# Patient Record
Sex: Male | Born: 1995 | Race: Black or African American | Hispanic: No | Marital: Single | Smoking: Never smoker
Health system: Southern US, Community
[De-identification: ages and names within clinical notes are randomized; demographics above are authoritative.]

## PROBLEM LIST (undated history)

## (undated) ENCOUNTER — Ambulatory Visit: Admission: EM | Payer: Medicaid Other | Source: Home / Self Care

## (undated) DIAGNOSIS — J45909 Unspecified asthma, uncomplicated: Secondary | ICD-10-CM

## (undated) DIAGNOSIS — F840 Autistic disorder: Secondary | ICD-10-CM

## (undated) DIAGNOSIS — F419 Anxiety disorder, unspecified: Secondary | ICD-10-CM

---
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
## 2014-03-07 ENCOUNTER — Emergency Department: Payer: Self-pay | Admitting: Emergency Medicine

## 2014-03-24 ENCOUNTER — Emergency Department: Payer: Self-pay | Admitting: Emergency Medicine

## 2014-04-15 ENCOUNTER — Emergency Department: Payer: Self-pay | Admitting: Internal Medicine

## 2014-05-16 ENCOUNTER — Emergency Department: Payer: Self-pay | Admitting: Emergency Medicine

## 2014-05-16 LAB — URINALYSIS, COMPLETE
Bacteria: NONE SEEN
Bilirubin,UR: NEGATIVE
Blood: NEGATIVE
Glucose,UR: NEGATIVE mg/dL (ref 0–75)
Ketone: NEGATIVE
Leukocyte Esterase: NEGATIVE
Nitrite: NEGATIVE
PROTEIN: NEGATIVE
Ph: 7 (ref 4.5–8.0)
RBC, UR: NONE SEEN /HPF (ref 0–5)
Specific Gravity: 1.004 (ref 1.003–1.030)
Squamous Epithelial: NONE SEEN
WBC UR: <1 /HPF (ref 0–5)

## 2014-05-16 LAB — COMPREHENSIVE METABOLIC PANEL
ALT: 106 U/L — AB
Albumin: 4.1 g/dL (ref 3.8–5.6)
Alkaline Phosphatase: 110 U/L
Anion Gap: 6 — ABNORMAL LOW (ref 7–16)
BILIRUBIN TOTAL: 0.4 mg/dL (ref 0.2–1.0)
BUN: 12 mg/dL (ref 9–21)
CHLORIDE: 105 mmol/L (ref 97–107)
CO2: 28 mmol/L — AB (ref 16–25)
Calcium, Total: 8.3 mg/dL — ABNORMAL LOW (ref 9.0–10.7)
Creatinine: 0.83 mg/dL (ref 0.60–1.30)
EGFR (African American): >60
EGFR (Non-African Amer.): >60
Glucose: 84 mg/dL (ref 65–99)
OSMOLALITY: 276 (ref 275–301)
Potassium: 3.9 mmol/L (ref 3.3–4.7)
SGOT(AST): 62 U/L — ABNORMAL HIGH (ref 10–41)
Sodium: 139 mmol/L (ref 132–141)
Total Protein: 7.4 g/dL (ref 6.4–8.6)

## 2014-05-16 LAB — CBC WITH DIFFERENTIAL/PLATELET
BASOS PCT: 1.1 %
Basophil #: 0.1 10*3/uL (ref 0.0–0.1)
EOS PCT: 1.8 %
Eosinophil #: 0.1 10*3/uL (ref 0.0–0.7)
HCT: 49.1 % (ref 40.0–52.0)
HGB: 16.3 g/dL (ref 13.0–18.0)
Lymphocyte #: 1.8 10*3/uL (ref 1.0–3.6)
Lymphocyte %: 31.6 %
MCH: 28 pg (ref 26.0–34.0)
MCHC: 33.1 g/dL (ref 32.0–36.0)
MCV: 85 fL (ref 80–100)
MONO ABS: 0.7 x10 3/mm (ref 0.2–1.0)
MONOS PCT: 12.8 %
NEUTROS ABS: 3 10*3/uL (ref 1.4–6.5)
NEUTROS PCT: 52.7 %
PLATELETS: 216 10*3/uL (ref 150–440)
RBC: 5.82 10*6/uL (ref 4.40–5.90)
RDW: 13.4 % (ref 11.5–14.5)
WBC: 5.7 10*3/uL (ref 3.8–10.6)

## 2014-05-16 LAB — LIPASE, BLOOD: Lipase: 95 U/L (ref 73–393)

## 2014-05-17 ENCOUNTER — Emergency Department: Payer: Self-pay | Admitting: Emergency Medicine

## 2014-05-26 ENCOUNTER — Emergency Department: Payer: Self-pay | Admitting: Emergency Medicine

## 2014-05-26 LAB — COMPREHENSIVE METABOLIC PANEL
ALK PHOS: 107 U/L
Albumin: 4 g/dL (ref 3.8–5.6)
Anion Gap: 10 (ref 7–16)
BILIRUBIN TOTAL: 0.4 mg/dL (ref 0.2–1.0)
BUN: 11 mg/dL (ref 9–21)
CHLORIDE: 106 mmol/L (ref 97–107)
Calcium, Total: 8.1 mg/dL — ABNORMAL LOW (ref 9.0–10.7)
Co2: 25 mmol/L (ref 16–25)
Creatinine: 0.75 mg/dL (ref 0.60–1.30)
EGFR (Non-African Amer.): >60
GLUCOSE: 121 mg/dL — AB (ref 65–99)
OSMOLALITY: 282 (ref 275–301)
Potassium: 3.8 mmol/L (ref 3.3–4.7)
SGOT(AST): 70 U/L — ABNORMAL HIGH (ref 10–41)
SGPT (ALT): 78 U/L — ABNORMAL HIGH
Sodium: 141 mmol/L (ref 132–141)
TOTAL PROTEIN: 7.1 g/dL (ref 6.4–8.6)

## 2014-05-26 LAB — URINALYSIS, COMPLETE
BLOOD: NEGATIVE
Bacteria: NONE SEEN
Bilirubin,UR: NEGATIVE
GLUCOSE, UR: NEGATIVE mg/dL (ref 0–75)
Ketone: NEGATIVE
Leukocyte Esterase: NEGATIVE
Nitrite: NEGATIVE
PH: 7 (ref 4.5–8.0)
Protein: NEGATIVE
RBC,UR: NONE SEEN /HPF (ref 0–5)
SPECIFIC GRAVITY: 1.021 (ref 1.003–1.030)
Squamous Epithelial: <1

## 2014-05-26 LAB — CBC WITH DIFFERENTIAL/PLATELET
Basophil #: 0 10*3/uL (ref 0.0–0.1)
Basophil %: 0.4 %
EOS ABS: 0.3 10*3/uL (ref 0.0–0.7)
Eosinophil %: 3.9 %
HCT: 48.1 % (ref 40.0–52.0)
HGB: 16.1 g/dL (ref 13.0–18.0)
LYMPHS ABS: 1.7 10*3/uL (ref 1.0–3.6)
LYMPHS PCT: 22.5 %
MCH: 28.1 pg (ref 26.0–34.0)
MCHC: 33.4 g/dL (ref 32.0–36.0)
MCV: 84 fL (ref 80–100)
MONO ABS: 0.9 x10 3/mm (ref 0.2–1.0)
Monocyte %: 11.8 %
Neutrophil #: 4.7 10*3/uL (ref 1.4–6.5)
Neutrophil %: 61.4 %
PLATELETS: 195 10*3/uL (ref 150–440)
RBC: 5.72 10*6/uL (ref 4.40–5.90)
RDW: 13.8 % (ref 11.5–14.5)
WBC: 7.7 10*3/uL (ref 3.8–10.6)

## 2014-05-26 LAB — LIPASE, BLOOD: Lipase: 86 U/L (ref 73–393)

## 2014-09-06 ENCOUNTER — Emergency Department: Payer: Self-pay | Admitting: Emergency Medicine

## 2014-09-06 LAB — COMPREHENSIVE METABOLIC PANEL
Albumin: 3.7 g/dL — ABNORMAL LOW (ref 3.8–5.6)
Alkaline Phosphatase: 103 U/L (ref 46–116)
Anion Gap: 6 — ABNORMAL LOW (ref 7–16)
BILIRUBIN TOTAL: 0.4 mg/dL (ref 0.2–1.0)
BUN: 11 mg/dL (ref 7–18)
CHLORIDE: 105 mmol/L (ref 98–107)
Calcium, Total: 8.3 mg/dL — ABNORMAL LOW (ref 9.0–10.7)
Co2: 29 mmol/L (ref 21–32)
Creatinine: 0.86 mg/dL (ref 0.60–1.30)
EGFR (African American): >60
Glucose: 94 mg/dL (ref 65–99)
Osmolality: 279 (ref 275–301)
POTASSIUM: 3.8 mmol/L (ref 3.5–5.1)
SGOT(AST): 46 U/L — ABNORMAL HIGH (ref 10–41)
SGPT (ALT): 72 U/L — ABNORMAL HIGH (ref 14–63)
Sodium: 140 mmol/L (ref 136–145)
Total Protein: 6.9 g/dL (ref 6.4–8.6)

## 2014-09-06 LAB — CBC WITH DIFFERENTIAL/PLATELET
BASOS ABS: 0.1 10*3/uL (ref 0.0–0.1)
Basophil %: 0.7 %
EOS ABS: 0.2 10*3/uL (ref 0.0–0.7)
EOS PCT: 2.3 %
HCT: 46.6 % (ref 40.0–52.0)
HGB: 15.7 g/dL (ref 13.0–18.0)
LYMPHS PCT: 27.2 %
Lymphocyte #: 2.4 10*3/uL (ref 1.0–3.6)
MCH: 27.9 pg (ref 26.0–34.0)
MCHC: 33.7 g/dL (ref 32.0–36.0)
MCV: 83 fL (ref 80–100)
MONO ABS: 1.2 x10 3/mm — AB (ref 0.2–1.0)
MONOS PCT: 13.3 %
NEUTROS PCT: 56.5 %
Neutrophil #: 4.9 10*3/uL (ref 1.4–6.5)
Platelet: 190 10*3/uL (ref 150–440)
RBC: 5.62 10*6/uL (ref 4.40–5.90)
RDW: 13.5 % (ref 11.5–14.5)
WBC: 8.6 10*3/uL (ref 3.8–10.6)

## 2014-09-06 LAB — URINALYSIS, COMPLETE
BLOOD: NEGATIVE
Bilirubin,UR: NEGATIVE
Glucose,UR: NEGATIVE mg/dL (ref 0–75)
Ketone: NEGATIVE
LEUKOCYTE ESTERASE: NEGATIVE
NITRITE: NEGATIVE
PROTEIN: NEGATIVE
Ph: 8 (ref 4.5–8.0)
RBC, UR: NONE SEEN /HPF (ref 0–5)
Specific Gravity: 1.016 (ref 1.003–1.030)
Squamous Epithelial: NONE SEEN
WBC UR: <1 /HPF (ref 0–5)

## 2014-09-06 LAB — LIPASE, BLOOD: Lipase: 87 U/L (ref 73–393)

## 2014-10-17 ENCOUNTER — Emergency Department: Payer: Self-pay | Admitting: Student

## 2014-11-08 ENCOUNTER — Emergency Department
Admit: 2014-11-08 | Disposition: A | Discharge status: Home / Self Care | Payer: Self-pay | Admitting: Emergency Medicine

## 2014-11-18 ENCOUNTER — Emergency Department
Admit: 2014-11-18 | Disposition: A | Discharge status: Home / Self Care | Payer: Self-pay | Admitting: Emergency Medicine

## 2014-11-18 LAB — CBC WITH DIFFERENTIAL/PLATELET
BASOS ABS: 0.1 10*3/uL (ref 0.0–0.1)
BASOS PCT: 1 %
EOS PCT: 6.9 %
Eosinophil #: 0.6 10*3/uL (ref 0.0–0.7)
HCT: 48.4 % (ref 40.0–52.0)
HGB: 16.5 g/dL (ref 13.0–18.0)
LYMPHS PCT: 26.6 %
Lymphocyte #: 2.4 10*3/uL (ref 1.0–3.6)
MCH: 27.7 pg (ref 26.0–34.0)
MCHC: 34 g/dL (ref 32.0–36.0)
MCV: 81 fL (ref 80–100)
Monocyte #: 1.2 x10 3/mm — ABNORMAL HIGH (ref 0.2–1.0)
Monocyte %: 13.4 %
NEUTROS ABS: 4.7 10*3/uL (ref 1.4–6.5)
Neutrophil %: 52.1 %
Platelet: 193 10*3/uL (ref 150–440)
RBC: 5.95 10*6/uL — ABNORMAL HIGH (ref 4.40–5.90)
RDW: 13.7 % (ref 11.5–14.5)
WBC: 9 10*3/uL (ref 3.8–10.6)

## 2014-11-18 LAB — URINALYSIS, COMPLETE
BACTERIA: NONE SEEN
BILIRUBIN, UR: NEGATIVE
Blood: NEGATIVE
GLUCOSE, UR: NEGATIVE mg/dL (ref 0–75)
KETONE: NEGATIVE
LEUKOCYTE ESTERASE: NEGATIVE
Nitrite: NEGATIVE
Ph: 6 (ref 4.5–8.0)
Protein: NEGATIVE
SQUAMOUS EPITHELIAL: NONE SEEN
Specific Gravity: 1.02 (ref 1.003–1.030)

## 2014-11-18 LAB — COMPREHENSIVE METABOLIC PANEL
ALBUMIN: 4.6 g/dL
ALK PHOS: 95 U/L
ALT: 33 U/L
ANION GAP: 8 (ref 7–16)
BUN: 12 mg/dL
Bilirubin,Total: 0.7 mg/dL
CHLORIDE: 106 mmol/L
CO2: 28 mmol/L
Calcium, Total: 8.9 mg/dL
Creatinine: 0.86 mg/dL
EGFR (African American): >60
EGFR (Non-African Amer.): >60
Glucose: 99 mg/dL
Potassium: 3.9 mmol/L
SGOT(AST): 30 U/L
Sodium: 142 mmol/L
TOTAL PROTEIN: 7.4 g/dL

## 2014-11-18 LAB — LIPASE, BLOOD: LIPASE: 26 U/L

## 2014-12-15 ENCOUNTER — Emergency Department
Admission: EM | Admit: 2014-12-15 | Discharge: 2014-12-15 | Disposition: A | Discharge status: Home / Self Care | Payer: Medicaid Other | Attending: Emergency Medicine | Admitting: Emergency Medicine

## 2014-12-15 DIAGNOSIS — T22132A Burn of first degree of left upper arm, initial encounter: Secondary | ICD-10-CM | POA: Insufficient documentation

## 2014-12-15 DIAGNOSIS — Y93G9 Activity, other involving cooking and grilling: Secondary | ICD-10-CM | POA: Insufficient documentation

## 2014-12-15 DIAGNOSIS — Y92000 Kitchen of unspecified non-institutional (private) residence as  the place of occurrence of the external cause: Secondary | ICD-10-CM | POA: Insufficient documentation

## 2014-12-15 DIAGNOSIS — Y998 Other external cause status: Secondary | ICD-10-CM | POA: Diagnosis not present

## 2014-12-15 DIAGNOSIS — X150XXA Contact with hot stove (kitchen), initial encounter: Secondary | ICD-10-CM | POA: Insufficient documentation

## 2014-12-15 DIAGNOSIS — T2210XA Burn of first degree of shoulder and upper limb, except wrist and hand, unspecified site, initial encounter: Secondary | ICD-10-CM

## 2014-12-15 MED ORDER — IBUPROFEN 800 MG PO TABS: 800.0000 mg | ORAL_TABLET | Freq: Three times a day (TID) | ORAL | Status: AC | PRN
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
## 2014-12-15 NOTE — Discharge Instructions (Signed)
Burn Care °Burns hurt your skin. When your skin is hurt, it is easier to get an infection. Follow your doctor's directions to help prevent an infection. °HOME CARE °· Wash your hands well before you change your bandage. °· Change your bandage as often as told by your doctor. °¨ Remove the old bandage. If the bandage sticks, soak it off with cool, clean water. °¨ Gently clean the burn with mild soap and water. °¨ Pat the burn dry with a clean, dry cloth. °¨ Put a thin layer of medicated cream on the burn. °¨ Put a clean bandage on as told by your doctor. °¨ Keep the bandage clean and dry. °· Raise (elevate) the burn for the first 24 hours. After that, follow your doctor's directions. °· Only take medicine as told by your doctor. °GET HELP RIGHT AWAY IF:  °· You have too much pain. °· The skin near the burn is red, tender, puffy (swollen), or has red streaks. °· The burn area has yellowish white fluid (pus) or a bad smell coming from it. °· You have a fever. °MAKE SURE YOU:  °· Understand these instructions. °· Will watch your condition. °· Will get help right away if you are not doing well or get worse. °Document Released: 04/30/2008 Document Revised: 10/14/2011 Document Reviewed: 12/12/2010 °ExitCare® Patient Information ©2015 ExitCare, [employer]. This information is not intended to replace advice given to you by your health care provider. Make sure you discuss any questions you have with your health care provider. ° °
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
## 2015-05-01 ENCOUNTER — Encounter: Payer: Self-pay | Admitting: Emergency Medicine

## 2015-05-01 ENCOUNTER — Emergency Department
Admission: EM | Admit: 2015-05-01 | Discharge: 2015-05-01 | Disposition: A | Discharge status: Home / Self Care | Payer: No Typology Code available for payment source | Attending: Emergency Medicine | Admitting: Emergency Medicine

## 2015-05-01 DIAGNOSIS — Y998 Other external cause status: Secondary | ICD-10-CM | POA: Insufficient documentation

## 2015-05-01 DIAGNOSIS — Y92410 Unspecified street and highway as the place of occurrence of the external cause: Secondary | ICD-10-CM | POA: Diagnosis not present

## 2015-05-01 DIAGNOSIS — Y9389 Activity, other specified: Secondary | ICD-10-CM | POA: Diagnosis not present

## 2015-05-01 DIAGNOSIS — S199XXA Unspecified injury of neck, initial encounter: Secondary | ICD-10-CM | POA: Diagnosis present

## 2015-05-01 DIAGNOSIS — S161XXA Strain of muscle, fascia and tendon at neck level, initial encounter: Secondary | ICD-10-CM | POA: Diagnosis not present

## 2015-05-01 MED ORDER — IBUPROFEN 800 MG PO TABS: 800.0000 mg | ORAL_TABLET | Freq: Three times a day (TID) | ORAL | Status: DC | PRN

## 2015-05-01 MED ORDER — CYCLOBENZAPRINE HCL 10 MG PO TABS: 10.0000 mg | ORAL_TABLET | Freq: Three times a day (TID) | ORAL | Status: DC | PRN

## 2015-05-01 NOTE — Discharge Instructions (Signed)
Cervical Sprain A cervical sprain is when the tissues (ligaments) that hold the neck bones in place stretch or tear. HOME CARE   Put ice on the injured area.  Put ice in a plastic bag.  Place a towel between your skin and the bag.  Leave the ice on for 15-20 minutes, 3-4 times a day.  You may have been given a collar to wear. This collar keeps your neck from moving while you heal.  Do not take the collar off unless told by your doctor.  If you have long hair, keep it outside of the collar.  Ask your doctor before changing the position of your collar. You may need to change its position over time to make it more comfortable.  If you are allowed to take off the collar for cleaning or bathing, follow your doctor's instructions on how to do it safely.  Keep your collar clean by wiping it with mild soap and water. Dry it completely. If the collar has removable pads, remove them every 1-2 days to hand wash them with soap and water. Allow them to air dry. They should be dry before you wear them in the collar.  Do not drive while wearing the collar.  Only take medicine as told by your doctor.  Keep all doctor visits as told.  Keep all physical therapy visits as told.  Adjust your work station so that you have good posture while you work.  Avoid positions and activities that make your problems worse.  Warm up and stretch before being active. GET HELP IF:  Your pain is not controlled with medicine.  You cannot take less pain medicine over time as planned.  Your activity level does not improve as expected. GET HELP RIGHT AWAY IF:   You are bleeding.  Your stomach is upset.  You have an allergic reaction to your medicine.  You develop new problems that you cannot explain.  You lose feeling (become numb) or you cannot move any part of your body (paralysis).  You have tingling or weakness in any part of your body.  Your symptoms get worse. Symptoms include:  Pain,  soreness, stiffness, puffiness (swelling), or a burning feeling in your neck.  Pain when your neck is touched.  Shoulder or upper back pain.  Limited ability to move your neck.  Headache.  Dizziness.  Your hands or arms feel week, lose feeling, or tingle.  Muscle spasms.  Difficulty swallowing or chewing. MAKE SURE YOU:   Understand these instructions.  Will watch your condition.  Will get help right away if you are not doing well or get worse. Document Released: 01/08/2008 Document Revised: 03/24/2013 Document Reviewed: 01/27/2013 ExitCare Patient Information 2015 [employer]. This information is not intended to replace advice given to you by your health care provider. Make sure you discuss any questions you have with your health care provider.  
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
## 2015-06-07 ENCOUNTER — Emergency Department: Payer: Medicaid Other

## 2015-06-07 ENCOUNTER — Emergency Department
Admission: EM | Admit: 2015-06-07 | Discharge: 2015-06-07 | Disposition: A | Discharge status: Home / Self Care | Payer: Medicaid Other | Attending: Emergency Medicine | Admitting: Emergency Medicine

## 2015-06-07 ENCOUNTER — Encounter: Payer: Self-pay | Admitting: Urgent Care

## 2015-06-07 DIAGNOSIS — J9801 Acute bronchospasm: Secondary | ICD-10-CM | POA: Insufficient documentation

## 2015-06-07 DIAGNOSIS — R05 Cough: Secondary | ICD-10-CM | POA: Diagnosis present

## 2015-06-07 MED ORDER — PSEUDOEPH-BROMPHEN-DM 30-2-10 MG/5ML PO SYRP: 5.0000 mL | ORAL_SOLUTION | Freq: Four times a day (QID) | ORAL | Status: DC | PRN

## 2015-06-07 MED ORDER — BENZONATATE 100 MG PO CAPS: 100.0000 mg | ORAL_CAPSULE | Freq: Three times a day (TID) | ORAL | Status: DC | PRN

## 2015-06-07 MED ORDER — ALBUTEROL SULFATE HFA 108 (90 BASE) MCG/ACT IN AERS: 2.0000 | INHALATION_SPRAY | Freq: Four times a day (QID) | RESPIRATORY_TRACT | Status: DC | PRN

## 2015-06-07 MED ORDER — BENZONATATE 100 MG PO CAPS
200.0000 mg | ORAL_CAPSULE | Freq: Once | ORAL | Status: AC
  Administered 2015-06-07: 200 mg via ORAL
  Filled 2015-06-07: qty 2

## 2015-06-07 MED ORDER — IPRATROPIUM-ALBUTEROL 0.5-2.5 (3) MG/3ML IN SOLN
3.0000 mL | Freq: Once | RESPIRATORY_TRACT | Status: AC
  Administered 2015-06-07: 3 mL via RESPIRATORY_TRACT
  Filled 2015-06-07: qty 3

## 2015-06-07 MED ORDER — AZITHROMYCIN 250 MG PO TABS: ORAL_TABLET | ORAL | Status: DC

## 2015-06-07 NOTE — ED Provider Notes (Signed)
[facility] Emergency Department Provider Note ____________________________________________  Time seen: 2255  I have reviewed the triage vital signs and the nursing notes.  HISTORY  Chief Complaint  Cough  HPI Fred Harris is a 19 y.o. male reports to the ED accompanied by his mother, for evaluation of a productive cough for the last 2 and half weeks. He reports that he's had yellow sputum intermittently with his cough. He denies any interim fever, chills, sweats. He also denies any significant shortness of breath with the symptoms he denies any history of sick contacts or recent travel. He is been dosing Tylenol cold and sinus as well as BC powders without significant change to his symptoms.He does not report any significant pain related to his current symptoms.  History reviewed. No pertinent past medical history.  There are no active problems to display for this patient.  History reviewed. No pertinent past surgical history.  Current Outpatient Rx  Name  Route  Sig  Dispense  Refill  . albuterol (PROVENTIL HFA;VENTOLIN HFA) 108 (90 BASE) MCG/ACT inhaler   Inhalation   Inhale 2 puffs into the lungs every 6 (six) hours as needed for wheezing or shortness of breath.   1 Inhaler   0   . azithromycin (ZITHROMAX Z-PAK) 250 MG tablet      Take 2 tablets (500 mg) on  Day 1,  followed by 1 tablet (250 mg) once daily on Days 2 through 5.   6 each   0   . benzonatate (TESSALON PERLES) 100 MG capsule   Oral   Take 1 capsule (100 mg total) by mouth 3 (three) times daily as needed for cough (Take 1-2 per dose).   30 capsule   0   . brompheniramine-pseudoephedrine-DM 30-2-10 MG/5ML syrup   Oral   Take 5 mLs by mouth 4 (four) times daily as needed.   120 mL   0   . cyclobenzaprine (FLEXERIL) 10 MG tablet   Oral   Take 1 tablet (10 mg total) by mouth every 8 (eight) hours as needed for muscle spasms.   15 tablet   0   . ibuprofen (ADVIL,MOTRIN) 800 MG  tablet   Oral   Take 1 tablet (800 mg total) by mouth every 8 (eight) hours as needed for moderate pain.   30 tablet   0   . ibuprofen (ADVIL,MOTRIN) 800 MG tablet   Oral   Take 1 tablet (800 mg total) by mouth every 8 (eight) hours as needed for moderate pain.   15 tablet   0     Allergies Review of patient's allergies indicates no known allergies.  No family history on file.  Social History Social History  Substance Use Topics  . Smoking status: Never Smoker   . Smokeless tobacco: None  . Alcohol Use: No   Review of Systems  Constitutional: Negative for fever. Eyes: Negative for visual changes. ENT: Negative for sore throat. Cardiovascular: Negative for chest pain. Respiratory: Negative for shortness of breath. Reports intermittent cough as above. Gastrointestinal: Negative for abdominal pain, vomiting and diarrhea. Genitourinary: Negative for dysuria. Musculoskeletal: Negative for back pain. Skin: Negative for rash. Neurological: Negative for headaches, focal weakness or numbness. ____________________________________________  PHYSICAL EXAM:  VITAL SIGNS: ED Triage Vitals  Enc Vitals Group     BP 06/07/15 2218 157/87 mmHg     Pulse Rate 06/07/15 2218 82     Resp 06/07/15 2218 18     Temp 06/07/15 2218 98.9 F (  37.2 C)     Temp Source 06/07/15 2218 Oral     SpO2 06/07/15 2218 96 %     Weight 06/07/15 2218 220 lb (99.791 kg)     Height 06/07/15 2218  (1.753 m)     Head Cir --      Peak Flow --      Pain Score 06/07/15 2219 0     Pain Loc --      Pain Edu? --      Excl. in GC? --    Constitutional: Alert and oriented. Well appearing and in no distress. Head: Normocephalic and atraumatic.      Eyes: Conjunctivae are normal. PERRL. Normal extraocular movements      Ears: Canals clear. TMs intact bilaterally.   Nose: No congestion/rhinorrhea.   Mouth/Throat: Mucous membranes are moist.   Neck: Supple. No  thyromegaly. Hematological/Lymphatic/Immunological: No cervical lymphadenopathy. Cardiovascular: Normal rate, regular rhythm.  Respiratory: Normal respiratory effort. No wheezes/rales/rhonchi. Gastrointestinal: Soft and nontender. No distention. Musculoskeletal: Nontender with normal range of motion in all extremities.  Neurologic:  Normal gait without ataxia. Normal speech and language. No gross focal neurologic deficits are appreciated. Skin:  Skin is warm, dry and intact. No rash noted. Psychiatric: Mood and affect are normal. Patient exhibits appropriate insight and judgment. ____________________________________________   RADIOLOGY CXR IMPRESSION: Increased lung volumes can be seen with reactive airway disease without focal consolidation.  I, Jarrah Babich, Charlesetta Ivory, personally viewed and evaluated these images (plain radiographs) as part of my medical decision making.  ____________________________________________  PROCEDURES  Duoneb x 1 Tessalon cap 200 mg PO ____________________________________________  INITIAL IMPRESSION / ASSESSMENT AND PLAN / ED COURSE  Acute bronchospasm likely with underlying reactive airway disease. Patient will be discharged with prescriptions for albuterol MDI, Tessalon Perles, Bromfed DM, and a Z-Pak. He is encouraged to follow-up with his primary care provider for worsening symptoms. He is given instructions on reasons to return to the ED. School note is provided for 1 day as requested. ____________________________________________  FINAL CLINICAL IMPRESSION(S) / ED DIAGNOSES  Final diagnoses:  Bronchospasm, acute      Lissa Hoard, PA-C 06/07/15 2350  Darien Ramus, MD 06/12/15 1355

## 2015-06-07 NOTE — Discharge Instructions (Signed)
Bronchospasm, Adult  A bronchospasm is a spasm or tightening of the airways going into the lungs. During a bronchospasm breathing becomes more difficult because the airways get smaller. When this happens there can be coughing, a whistling sound when breathing (wheezing), and difficulty breathing. Bronchospasm is often associated with asthma, but not all patients who experience a bronchospasm have asthma.  CAUSES   A bronchospasm is caused by inflammation or irritation of the airways. The inflammation or irritation may be triggered by:   · Allergies (such as to animals, pollen, food, or mold). Allergens that cause bronchospasm may cause wheezing immediately after exposure or many hours later.    · Infection. Viral infections are believed to be the most common cause of bronchospasm.    · Exercise.    · Irritants (such as pollution, cigarette smoke, strong odors, aerosol sprays, and paint fumes).    · Weather changes. Winds increase molds and pollens in the air. Rain refreshes the air by washing irritants out. Cold air may cause inflammation.    · Stress and emotional upset.    SIGNS AND SYMPTOMS   · Wheezing.    · Excessive nighttime coughing.    · Frequent or severe coughing with a simple cold.    · Chest tightness.    · Shortness of breath.    DIAGNOSIS   Bronchospasm is usually diagnosed through a history and physical exam. Tests, such as chest X-rays, are sometimes done to look for other conditions.  TREATMENT   · Inhaled medicines can be given to open up your airways and help you breathe. The medicines can be given using either an inhaler or a nebulizer machine.  · Corticosteroid medicines may be given for severe bronchospasm, usually when it is associated with asthma.  HOME CARE INSTRUCTIONS   · Always have a plan prepared for seeking medical care. Know when to call your health care provider and local emergency services (911 in the [place] Know where you can access local emergency care.  · Only take medicines as  directed by your health care provider.  · If you were prescribed an inhaler or nebulizer machine, ask your health care provider to explain how to use it correctly. Always use a spacer with your inhaler if you were given one.  · It is necessary to remain calm during an attack. Try to relax and breathe more slowly.   · Control your home environment in the following ways:      Change your heating and air conditioning filter at least once a month.      Limit your use of fireplaces and wood stoves.    Do not smoke and do not allow smoking in your home.      Avoid exposure to perfumes and fragrances.      Get rid of pests (such as roaches and mice) and their droppings.      Throw away plants if you see mold on them.      Keep your house clean and dust free.      Replace carpet with wood, tile, or vinyl flooring. Carpet can trap dander and dust.      Use allergy-proof pillows, mattress covers, and box spring covers.      Wash bed sheets and blankets every week in hot water and dry them in a dryer.      Use blankets that are made of polyester or cotton.      Wash hands frequently.  SEEK MEDICAL CARE IF:   · You have muscle aches.    · You have chest pain.    · The sputum changes from clear or   even after taking your prescribed medicines.   You have increased difficulty breathing.   You develop severe chest pain. MAKE SURE YOU:   Understand these instructions.  Will watch your condition.  Will get help right away if you are not doing well or get worse.   This information is not intended to replace advice given to you by your health care provider. Make sure you discuss any questions you have with your health care  provider.   Document Released: 07/25/2003 Document Revised: 08/12/2014 Document Reviewed: 01/11/2013 Elsevier Interactive Patient Education 2016 [employer]   Take the prescription meds as directed.  Increase fluid intake to reduce symptoms.  [facility] or your provider as needed.

## 2015-06-07 NOTE — ED Notes (Signed)
Patient presents with reports a productive cough x 3 weeks. (+) yellow sputum production reported. Denies fever. NOS reported.

## 2015-06-23 ENCOUNTER — Emergency Department
Admission: EM | Admit: 2015-06-23 | Discharge: 2015-06-23 | Disposition: A | Discharge status: Home / Self Care | Payer: Medicaid Other | Attending: Student | Admitting: Student

## 2015-06-23 ENCOUNTER — Encounter: Payer: Self-pay | Admitting: *Deleted

## 2015-06-23 DIAGNOSIS — J069 Acute upper respiratory infection, unspecified: Secondary | ICD-10-CM | POA: Diagnosis not present

## 2015-06-23 DIAGNOSIS — R05 Cough: Secondary | ICD-10-CM | POA: Diagnosis present

## 2015-06-23 HISTORY — DX: Unspecified asthma, uncomplicated: J45.909

## 2015-06-23 MED ORDER — PROMETHAZINE-DM 6.25-15 MG/5ML PO SYRP: 5.0000 mL | ORAL_SOLUTION | Freq: Four times a day (QID) | ORAL | Status: DC | PRN

## 2015-06-23 NOTE — Discharge Instructions (Signed)

## 2015-06-23 NOTE — ED Provider Notes (Signed)
[facility] Emergency Department Provider Note  ____________________________________________  Time seen: Approximately 2:54 PM  I have reviewed the triage vital signs and the nursing notes.   HISTORY  Chief Complaint Cough    HPI Fred Harris is a 19 y.o. male complaining of cough productive for 2 days. Patient was seen2 weeks ago but cannot afford the cough medication prescribed. Patient state his cough got better until yesterday. Patient denies any fever or chills associated with this complaint. Patient has no nausea vomiting or diarrhea. Patient state there is no pain.  Past Medical History  Diagnosis Date  . Asthma     There are no active problems to display for this patient.   History reviewed. No pertinent past surgical history.  Current Outpatient Rx  Name  Route  Sig  Dispense  Refill  . albuterol (PROVENTIL HFA;VENTOLIN HFA) 108 (90 BASE) MCG/ACT inhaler   Inhalation   Inhale 2 puffs into the lungs every 6 (six) hours as needed for wheezing or shortness of breath.   1 Inhaler   0   . azithromycin (ZITHROMAX Z-PAK) 250 MG tablet      Take 2 tablets (500 mg) on  Day 1,  followed by 1 tablet (250 mg) once daily on Days 2 through 5.   6 each   0   . benzonatate (TESSALON PERLES) 100 MG capsule   Oral   Take 1 capsule (100 mg total) by mouth 3 (three) times daily as needed for cough (Take 1-2 per dose).   30 capsule   0   . brompheniramine-pseudoephedrine-DM 30-2-10 MG/5ML syrup   Oral   Take 5 mLs by mouth 4 (four) times daily as needed.   120 mL   0   . cyclobenzaprine (FLEXERIL) 10 MG tablet   Oral   Take 1 tablet (10 mg total) by mouth every 8 (eight) hours as needed for muscle spasms.   15 tablet   0   . ibuprofen (ADVIL,MOTRIN) 800 MG tablet   Oral   Take 1 tablet (800 mg total) by mouth every 8 (eight) hours as needed for moderate pain.   30 tablet   0   . ibuprofen (ADVIL,MOTRIN) 800 MG tablet   Oral   Take 1  tablet (800 mg total) by mouth every 8 (eight) hours as needed for moderate pain.   15 tablet   0   . promethazine-dextromethorphan (PROMETHAZINE-DM) 6.25-15 MG/5ML syrup   Oral   Take 5 mLs by mouth 4 (four) times daily as needed for cough.   118 mL   0     Allergies Review of patient's allergies indicates no known allergies.  No family history on file.  Social History Social History  Substance Use Topics  . Smoking status: Never Smoker   . Smokeless tobacco: None  . Alcohol Use: No    Review of Systems Constitutional: No fever/chills Eyes: No visual changes. ENT: No sore throat. Cardiovascular: Denies chest pain. Respiratory: Denies shortness of breath. Gastrointestinal: No abdominal pain.  No nausea, no vomiting.  No diarrhea.  No constipation. Genitourinary: Negative for dysuria. Musculoskeletal: Negative for back pain. Skin: Negative for rash. Neurological: Negative for headaches, focal weakness or numbness. 10-point ROS otherwise negative.  ____________________________________________   PHYSICAL EXAM:  VITAL SIGNS: ED Triage Vitals  Enc Vitals Group     BP 06/23/15 1423 134/85 mmHg     Pulse --      Resp 06/23/15 1423 18  Temp 06/23/15 1423 98.4 F (36.9 C)     Temp Source 06/23/15 1416 Oral     SpO2 06/23/15 1416 97 %     Weight 06/23/15 1423 185 lb (83.915 kg)     Height 06/23/15 1423 5\' 8"  (1.727 m)     Head Cir --      Peak Flow --      Pain Score --      Pain Loc --      Pain Edu? --      Excl. in GC? --     Constitutional: Alert and oriented. Well appearing and in no acute distress. Eyes: Conjunctivae are normal. PERRL. EOMI. Head: Atraumatic. Nose: No congestion/rhinnorhea. Mouth/Throat: Mucous membranes are moist.  Oropharynx non-erythematous. Neck: No stridor.  No cervical spine tenderness to palpation. Hematological/Lymphatic/Immunilogical: No cervical lymphadenopathy. Cardiovascular: Normal rate, regular rhythm. Grossly normal  heart sounds.  Good peripheral circulation. Respiratory: Normal respiratory effort.  No retractions. Lungs CTAB. Dr. cough Gastrointestinal: Soft and nontender. No distention. No abdominal bruits. No CVA tenderness. Musculoskeletal: No lower extremity tenderness nor edema.  No joint effusions. Neurologic:  Normal speech and language. No gross focal neurologic deficits are appreciated. No gait instability. Skin:  Skin is warm, dry and intact. No rash noted. Psychiatric: Mood and affect are normal. Speech and behavior are normal.  ____________________________________________   LABS (all labs ordered are listed, but only abnormal results are displayed)  Labs Reviewed - No data to display ____________________________________________  EKG   ____________________________________________  RADIOLOGY   ____________________________________________   PROCEDURES  Procedure(s) performed: None  Critical Care performed: No  ____________________________________________   INITIAL IMPRESSION / ASSESSMENT AND PLAN / ED COURSE  Pertinent labs & imaging results that were available during my care of the patient were reviewed by me and considered in my medical decision making (see chart for details).  Upper respiratory infection. Given prescription for neck and Phenergan DM. [facility]. ____________________________________________   FINAL CLINICAL IMPRESSION(S) / ED DIAGNOSES  Final diagnoses:  URI (upper respiratory infection)      Joni Reiningonald K Malka Bocek, PA-C 06/23/15 1511  Gayla DossEryka A Gayle, MD 06/28/15 1546

## 2015-06-23 NOTE — ED Notes (Signed)
Mother reports fever for 3 days, mother denies any other symptoms, mother last gave Motrin yesterday , temp in triage 104 rectally

## 2015-06-23 NOTE — ED Notes (Signed)
Pt c/o cough with congestion since yesterday 

## 2015-06-23 NOTE — ED Notes (Signed)
Triage note entered in error

## 2016-02-13 ENCOUNTER — Emergency Department
Admission: EM | Admit: 2016-02-13 | Discharge: 2016-02-13 | Disposition: A | Discharge status: Home / Self Care | Payer: Medicaid Other | Attending: Emergency Medicine | Admitting: Emergency Medicine

## 2016-02-13 DIAGNOSIS — L239 Allergic contact dermatitis, unspecified cause: Secondary | ICD-10-CM | POA: Insufficient documentation

## 2016-02-13 DIAGNOSIS — Z792 Long term (current) use of antibiotics: Secondary | ICD-10-CM | POA: Insufficient documentation

## 2016-02-13 DIAGNOSIS — Z791 Long term (current) use of non-steroidal anti-inflammatories (NSAID): Secondary | ICD-10-CM | POA: Diagnosis not present

## 2016-02-13 DIAGNOSIS — J45909 Unspecified asthma, uncomplicated: Secondary | ICD-10-CM | POA: Diagnosis not present

## 2016-02-13 DIAGNOSIS — R21 Rash and other nonspecific skin eruption: Secondary | ICD-10-CM | POA: Diagnosis present

## 2016-02-13 DIAGNOSIS — Z79899 Other long term (current) drug therapy: Secondary | ICD-10-CM | POA: Diagnosis not present

## 2016-02-13 DIAGNOSIS — L309 Dermatitis, unspecified: Secondary | ICD-10-CM

## 2016-02-13 MED ORDER — HYDROXYZINE PAMOATE 25 MG PO CAPS: 25.0000 mg | ORAL_CAPSULE | Freq: Three times a day (TID) | ORAL | Status: DC | PRN

## 2016-02-13 MED ORDER — FAMOTIDINE 20 MG PO TABS
40.0000 mg | ORAL_TABLET | Freq: Once | ORAL | Status: AC
  Administered 2016-02-13: 40 mg via ORAL
  Filled 2016-02-13: qty 2

## 2016-02-13 MED ORDER — RANITIDINE HCL 150 MG PO TABS: 150.0000 mg | ORAL_TABLET | Freq: Two times a day (BID) | ORAL | Status: DC

## 2016-02-13 MED ORDER — DIPHENHYDRAMINE HCL 25 MG PO CAPS
50.0000 mg | ORAL_CAPSULE | Freq: Once | ORAL | Status: AC
Start: 2016-02-13 — End: 2016-02-13
  Administered 2016-02-13: 50 mg via ORAL
  Filled 2016-02-13: qty 2

## 2016-02-13 NOTE — ED Notes (Signed)
Pt c/o rash to upper back/shoulder area beginning Monday night

## 2016-02-13 NOTE — ED Notes (Signed)
Pt c/o of itching rash to upper/mid back beginning yesterday. Pt denies pain. Pt's mother reports she gave pt 500 mg of tylenol at 17:30.

## 2016-02-13 NOTE — Discharge Instructions (Signed)
Casimiro NeedleMichael has a rash and itching which may be related to an allergy. The cause is not clear to us today. Give the prescription meds as directed for itching, as needed. Follow-up with one of the local community clinics for ongoing care.   Allergies An allergy is an abnormal reaction to a substance by the body's defense system (immune system). Allergies can develop at any age. WHAT CAUSES ALLERGIES? An allergic reaction happens when the immune system mistakenly reacts to a normally harmless substance, called an allergen, as if it were harmful. The immune system releases antibodies to fight the substance. Antibodies eventually release a chemical called histamine into the bloodstream. The release of histamine is meant to protect the body from infection, but it also causes discomfort. An allergic reaction can be triggered by:  Eating an allergen.  Inhaling an allergen.  Touching an allergen. WHAT TYPES OF ALLERGIES ARE THERE? There are many types of allergies. Common types include:  Seasonal allergies. People with this type of allergy are usually allergic to substances that are only present during certain seasons, such as molds and pollens.  Food allergies.  Drug allergies.  Insect allergies.  Animal dander allergies. WHAT ARE SYMPTOMS OF ALLERGIES? Possible allergy symptoms include:  Swelling of the lips, face, tongue, mouth, or throat.  Sneezing, coughing, or wheezing.  Nasal congestion.  Tingling in the mouth.  Rash.  Itching.  Itchy, red, swollen areas of skin (hives).  Watery eyes.  Vomiting.  Diarrhea.  Dizziness.  Lightheadedness.  Fainting.  Trouble breathing or swallowing.  Chest tightness.  Rapid heartbeat. HOW ARE ALLERGIES DIAGNOSED? Allergies are diagnosed with a medical and family history and one or more of the following:  Skin tests.  Blood tests.  A food diary. A food diary is a record of all the foods and drinks you have in a day and of all  the symptoms you experience.  The results of an elimination diet. An elimination diet involves eliminating foods from your diet and then adding them back in one by one to find out if a certain food causes an allergic reaction. HOW ARE ALLERGIES TREATED? There is no cure for allergies, but allergic reactions can be treated with medicine. Severe reactions usually need to be treated at a hospital. HOW CAN REACTIONS BE PREVENTED? The best way to prevent an allergic reaction is by avoiding the substance you are allergic to. Allergy shots and medicines can also help prevent reactions in some cases. People with severe allergic reactions may be able to prevent a life-threatening reaction called anaphylaxis with a medicine given right after exposure to the allergen.   This information is not intended to replace advice given to you by your health care provider. Make sure you discuss any questions you have with your health care provider.   Document Released: 10/15/2002 Document Revised: 08/12/2014 Document Reviewed: 05/03/2014 Elsevier Interactive Patient Education 2016 [employer]  Rash A rash is a change in the color or feel of your skin. There are many different types of rashes. You may have other problems along with your rash. HOME CARE  Avoid the thing that caused your rash.  Do not scratch your rash.  You may take cools baths to help stop itching.  Only take medicines as told by your doctor.  Keep all doctor visits as told. GET HELP RIGHT AWAY IF:   Your pain, puffiness (swelling), or redness gets worse.  You have a fever.  You have new or severe problems.  You have  body aches, watery poop (diarrhea), or you throw up (vomit).  Your rash is not better after 3 days. MAKE SURE YOU:   Understand these instructions.  Will watch your condition.  Will get help right away if you are not doing well or get worse.   This information is not intended to replace advice given to you by  your health care provider. Make sure you discuss any questions you have with your health care provider.   Document Released: 01/08/2008 Document Revised: 10/14/2011 Document Reviewed: 12/07/2014 Elsevier Interactive Patient Education [employer]

## 2016-02-13 NOTE — ED Notes (Signed)
Reviewed d/c instructions, follow-up care, and prescriptions with pt and family. Pt and family verbalized understanding.  

## 2016-02-13 NOTE — ED Provider Notes (Signed)
[facility] Emergency Department Provider Note ____________________________________________  Time seen: 2106  I have reviewed the triage vital signs and the nursing notes.  HISTORY  Chief Complaint  Rash  HPI Fred Harris is a 20 y.o. male presents to the ED with a 1-day complaint of itchy rash to the upper back and shoulders. According to the patient is mother he has no known allergies, triggers, or allergies. He is unaware of any particular contact exposure may have had. He denies any interim fevers, chills, sweats also denies any irritation or swelling to the mouth, lips, throat, or tongue. Mom is given him Tylenol for itch relief but she denies any other medications given at this time. She did note that he seemed to have increased itching overnight especially when he got hot and sweaty.  Past Medical History  Diagnosis Date  . Asthma    There are no active problems to display for this patient.  History reviewed. No pertinent past surgical history.  Current Outpatient Rx  Name  Route  Sig  Dispense  Refill  . albuterol (PROVENTIL HFA;VENTOLIN HFA) 108 (90 BASE) MCG/ACT inhaler   Inhalation   Inhale 2 puffs into the lungs every 6 (six) hours as needed for wheezing or shortness of breath.   1 Inhaler   0   . azithromycin (ZITHROMAX Z-PAK) 250 MG tablet      Take 2 tablets (500 mg) on  Day 1,  followed by 1 tablet (250 mg) once daily on Days 2 through 5.   6 each   0   . benzonatate (TESSALON PERLES) 100 MG capsule   Oral   Take 1 capsule (100 mg total) by mouth 3 (three) times daily as needed for cough (Take 1-2 per dose).   30 capsule   0   . brompheniramine-pseudoephedrine-DM 30-2-10 MG/5ML syrup   Oral   Take 5 mLs by mouth 4 (four) times daily as needed.   120 mL   0   . cyclobenzaprine (FLEXERIL) 10 MG tablet   Oral   Take 1 tablet (10 mg total) by mouth every 8 (eight) hours as needed for muscle spasms.   15 tablet   0   .  hydrOXYzine (VISTARIL) 25 MG capsule   Oral   Take 1 capsule (25 mg total) by mouth 3 (three) times daily as needed for itching.   21 capsule   0   . ibuprofen (ADVIL,MOTRIN) 800 MG tablet   Oral   Take 1 tablet (800 mg total) by mouth every 8 (eight) hours as needed for moderate pain.   30 tablet   0   . ibuprofen (ADVIL,MOTRIN) 800 MG tablet   Oral   Take 1 tablet (800 mg total) by mouth every 8 (eight) hours as needed for moderate pain.   15 tablet   0   . promethazine-dextromethorphan (PROMETHAZINE-DM) 6.25-15 MG/5ML syrup   Oral   Take 5 mLs by mouth 4 (four) times daily as needed for cough.   118 mL   0   . ranitidine (ZANTAC) 150 MG tablet   Oral   Take 1 tablet (150 mg total) by mouth 2 (two) times daily.   20 tablet   0    Allergies Review of patient's allergies indicates no known allergies.  No family history on file.  Social History Social History  Substance Use Topics  . Smoking status: Never Smoker   . Smokeless tobacco: None  . Alcohol Use: No  Review of Systems  Constitutional: Negative for fever. Eyes: Negative for visual changes. ENT: Negative for sore throat. Cardiovascular: Negative for chest pain. Respiratory: Negative for shortness of breath. Skin: Positive for itchy rash. ____________________________________________  PHYSICAL EXAM:  VITAL SIGNS: ED Triage Vitals  Enc Vitals Group     BP 02/13/16 1952 161/85 mmHg     Pulse Rate 02/13/16 1952 88     Resp 02/13/16 1952 18     Temp 02/13/16 1952 98.3 F (36.8 C)     Temp Source 02/13/16 1952 Oral     SpO2 02/13/16 1952 99 %     Weight 02/13/16 1952 215 lb (97.523 kg)     Height 02/13/16 1952 5\' 9"  (1.753 m)     Head Cir --      Peak Flow --      Pain Score --      Pain Loc --      Pain Edu? --      Excl. in GC? --    Constitutional: Alert and oriented. Well appearing and in no distress. Head: Normocephalic and atraumatic. Eyes: Conjunctivae are normal. PERRL. Normal  extraocular movements Ears: Canals clear. TMs intact bilaterally. Nose: No congestion/rhinorrhea. Mouth/Throat: Mucous membranes are moist. Hematological/Lymphatic/Immunological: No cervical lymphadenopathy. Cardiovascular: Normal rate, regular rhythm.  Respiratory: Normal respiratory effort. No wheezes/rales/rhonchi. Musculoskeletal: Nontender with normal range of motion in all extremities.  Neurologic:  Normal gait without ataxia. Normal speech and language. No gross focal neurologic deficits are appreciated. Skin:  Skin is warm, dry and intact. Patient with a faint, fine, papular rash primarily to the upper back and shoulders. Excoriations are noted. No large whelps or hives are noted.. ____________________________________________  PROCEDURES  Diphenhydramine 50 mg PO Famotidine 40 mg PO ____________________________________________  INITIAL IMPRESSION / ASSESSMENT AND PLAN / ED COURSE  Patient with an allergic dermatitis of unknown etiology at this time. He is discharged with prescriptions for Zantac and Vistaril dose as directed. He will follow primary care provider or treatment [facility] the [facility] ongoing medical care. Return precautions were reviewed. ____________________________________________  FINAL CLINICAL IMPRESSION(S) / ED DIAGNOSES  Final diagnoses:  Dermatitis  Allergic dermatitis     Lissa HoardJenise V Bacon Abeer Deskins, PA-C 02/13/16 [id number]  Nita Sicklearolina Veronese, MD 02/14/16 [phone]

## 2016-08-15 ENCOUNTER — Emergency Department: Payer: Medicaid Other

## 2016-08-15 ENCOUNTER — Encounter: Payer: Self-pay | Admitting: Emergency Medicine

## 2016-08-15 ENCOUNTER — Emergency Department
Admission: EM | Admit: 2016-08-15 | Discharge: 2016-08-15 | Disposition: A | Discharge status: Home / Self Care | Payer: Medicaid Other | Attending: Emergency Medicine | Admitting: Emergency Medicine

## 2016-08-15 DIAGNOSIS — R0689 Other abnormalities of breathing: Secondary | ICD-10-CM | POA: Insufficient documentation

## 2016-08-15 DIAGNOSIS — J45909 Unspecified asthma, uncomplicated: Secondary | ICD-10-CM | POA: Diagnosis not present

## 2016-08-15 DIAGNOSIS — R112 Nausea with vomiting, unspecified: Secondary | ICD-10-CM | POA: Insufficient documentation

## 2016-08-15 DIAGNOSIS — R1013 Epigastric pain: Secondary | ICD-10-CM | POA: Diagnosis not present

## 2016-08-15 LAB — COMPREHENSIVE METABOLIC PANEL
ALT: 30 U/L (ref 17–63)
AST: 36 U/L (ref 15–41)
Albumin: 4.7 g/dL (ref 3.5–5.0)
Alkaline Phosphatase: 75 U/L (ref 38–126)
Anion gap: 9 (ref 5–15)
BILIRUBIN TOTAL: 0.5 mg/dL (ref 0.3–1.2)
BUN: 15 mg/dL (ref 6–20)
CO2: 26 mmol/L (ref 22–32)
Calcium: 8.9 mg/dL (ref 8.9–10.3)
Chloride: 105 mmol/L (ref 101–111)
Creatinine, Ser: 0.89 mg/dL (ref 0.61–1.24)
GFR calc Af Amer: >60 mL/min (ref >60)
Glucose, Bld: 103 mg/dL — ABNORMAL HIGH (ref 65–99)
Potassium: 3.3 mmol/L — ABNORMAL LOW (ref 3.5–5.1)
Sodium: 140 mmol/L (ref 135–145)
TOTAL PROTEIN: 7.7 g/dL (ref 6.5–8.1)

## 2016-08-15 LAB — URINALYSIS, COMPLETE (UACMP) WITH MICROSCOPIC
BACTERIA UA: NONE SEEN
Bilirubin Urine: NEGATIVE
GLUCOSE, UA: NEGATIVE mg/dL
HGB URINE DIPSTICK: NEGATIVE
Ketones, ur: 5 mg/dL — AB
LEUKOCYTES UA: NEGATIVE
NITRITE: NEGATIVE
Protein, ur: 30 mg/dL — AB
SPECIFIC GRAVITY, URINE: 1.026 (ref 1.005–1.030)
pH: 6 (ref 5.0–8.0)

## 2016-08-15 LAB — CBC
HEMATOCRIT: 48.5 % (ref 40.0–52.0)
Hemoglobin: 16.9 g/dL (ref 13.0–18.0)
MCH: 27.8 pg (ref 26.0–34.0)
MCHC: 34.8 g/dL (ref 32.0–36.0)
MCV: 80 fL (ref 80.0–100.0)
PLATELETS: 216 10*3/uL (ref 150–440)
RBC: 6.06 MIL/uL — ABNORMAL HIGH (ref 4.40–5.90)
RDW: 13.2 % (ref 11.5–14.5)
WBC: 9.1 10*3/uL (ref 3.8–10.6)

## 2016-08-15 LAB — TROPONIN I
Troponin I: <0.03 ng/mL (ref <0.03)
Troponin I: <0.03 ng/mL (ref <0.03)

## 2016-08-15 LAB — LIPASE, BLOOD: Lipase: 19 U/L (ref 11–51)

## 2016-08-15 MED ORDER — FAMOTIDINE IN NACL 20-0.9 MG/50ML-% IV SOLN
20.0000 mg | Freq: Once | INTRAVENOUS | Status: AC
  Administered 2016-08-15: 20 mg via INTRAVENOUS
  Filled 2016-08-15 (×2): qty 50

## 2016-08-15 MED ORDER — SODIUM CHLORIDE 0.9 % IV BOLUS (SEPSIS)
1000.0000 mL | Freq: Once | INTRAVENOUS | Status: AC
  Administered 2016-08-15: 1000 mL via INTRAVENOUS

## 2016-08-15 MED ORDER — ACETAMINOPHEN 500 MG PO TABS
1000.0000 mg | ORAL_TABLET | Freq: Once | ORAL | Status: AC
  Administered 2016-08-15: 1000 mg via ORAL
  Filled 2016-08-15: qty 2

## 2016-08-15 MED ORDER — ONDANSETRON HCL 4 MG/2ML IJ SOLN
4.0000 mg | Freq: Once | INTRAMUSCULAR | Status: AC
  Administered 2016-08-15: 4 mg via INTRAVENOUS
  Filled 2016-08-15: qty 2

## 2016-08-15 MED ORDER — ONDANSETRON 4 MG PO TBDP
4.0000 mg | ORAL_TABLET | Freq: Once | ORAL | Status: AC | PRN
  Administered 2016-08-15: 4 mg via ORAL
  Filled 2016-08-15: qty 1

## 2016-08-15 MED ORDER — IOPAMIDOL (ISOVUE-370) INJECTION 76%
100.0000 mL | Freq: Once | INTRAVENOUS | Status: AC | PRN
  Administered 2016-08-15: 100 mL via INTRAVENOUS

## 2016-08-15 NOTE — ED Provider Notes (Signed)
[facility] Emergency Department Provider Note  ____________________________________________  Time seen: Approximately 8:53 AM  I have reviewed the triage vital signs and the nursing notes.   HISTORY  Chief Complaint Emesis   HPI Fred Harris is a 21 y.o. male who presents for evaluation of vomiting and epigastric abdominal pain. Patient started vomiting at 3:30 AM and has had 6 episodes of nonbloody nonbilious emesis. No diarrhea, no fever or chills, no coughing, no chest pain or shortness of breath. Patient initially had epigastric abdominal pain that he describes as dull, improves after vomiting, located in the epigastric region and nonradiating. The pain is now resolved. Patient has had 2 episodes of vomiting in the emergency room. Mother has had similar symptoms last week. No personal or family history of ischemic heart disease, no chest pain or shortness of breath, no history of smoking.  Past Medical History:  Diagnosis Date  . Asthma     There are no active problems to display for this patient.   History reviewed. No pertinent surgical history.  Prior to Admission medications   Medication Sig Start Date End Date Taking? Authorizing Provider  albuterol (PROVENTIL HFA;VENTOLIN HFA) 108 (90 BASE) MCG/ACT inhaler Inhale 2 puffs into the lungs every 6 (six) hours as needed for wheezing or shortness of breath. Patient not taking: Reported on 08/15/2016 06/07/15   Charlesetta IvoryJenise V Bacon Menshew, PA-C  benzonatate (TESSALON PERLES) 100 MG capsule Take 1 capsule (100 mg total) by mouth 3 (three) times daily as needed for cough (Take 1-2 per dose). Patient not taking: Reported on 08/15/2016 06/07/15   Charlesetta IvoryJenise V Bacon Menshew, PA-C  brompheniramine-pseudoephedrine-DM 30-2-10 MG/5ML syrup Take 5 mLs by mouth 4 (four) times daily as needed. Patient not taking: Reported on 08/15/2016 06/07/15   Charlesetta IvoryJenise V Bacon Menshew, PA-C  cyclobenzaprine (FLEXERIL) 10 MG tablet Take 1  tablet (10 mg total) by mouth every 8 (eight) hours as needed for muscle spasms. Patient not taking: Reported on 08/15/2016 05/01/15   Joni Reiningonald K Smith, PA-C  hydrOXYzine (VISTARIL) 25 MG capsule Take 1 capsule (25 mg total) by mouth 3 (three) times daily as needed for itching. Patient not taking: Reported on 08/15/2016 02/13/16   Charlesetta IvoryJenise V Bacon Menshew, PA-C  ibuprofen (ADVIL,MOTRIN) 800 MG tablet Take 1 tablet (800 mg total) by mouth every 8 (eight) hours as needed for moderate pain. Patient not taking: Reported on 08/15/2016 12/15/14   Tommi Rumpshonda L Summers, PA-C  promethazine-dextromethorphan (PROMETHAZINE-DM) 6.25-15 MG/5ML syrup Take 5 mLs by mouth 4 (four) times daily as needed for cough. Patient not taking: Reported on 08/15/2016 06/23/15   Joni Reiningonald K Smith, PA-C  ranitidine (ZANTAC) 150 MG tablet Take 1 tablet (150 mg total) by mouth 2 (two) times daily. Patient not taking: Reported on 08/15/2016 02/13/16   Charlesetta IvoryJenise V Bacon Menshew, PA-C    Allergies Patient has no known allergies.  No family history on file.  Social History Social History  Substance Use Topics  . Smoking status: Never Smoker  . Smokeless tobacco: Never Used  . Alcohol use No    Review of Systems  Constitutional: Negative for fever. Eyes: Negative for visual changes. ENT: Negative for sore throat. Neck: No neck pain  Cardiovascular: Negative for chest pain. Respiratory: Negative for shortness of breath. Gastrointestinal: + epigastric abdominal pain, nausea, and vomiting. No diarrhea. Genitourinary: Negative for dysuria. Musculoskeletal: Negative for back pain. Skin: Negative for rash. Neurological: Negative for headaches, weakness or numbness. Psych: No SI or HI  ____________________________________________  PHYSICAL EXAM:  VITAL SIGNS: ED Triage Vitals  Enc Vitals Group     BP 08/15/16 0304 137/88     Pulse Rate 08/15/16 0304 (!) 110     Resp 08/15/16 0304 18     Temp 08/15/16 0304 97.8 F (36.6 C)      Temp Source 08/15/16 0304 Oral     SpO2 08/15/16 0304 97 %     Weight 08/15/16 0305 21 lb 9.6 oz (9.798 kg)     Height 08/15/16 0305 5\' 7"  (1.702 m)     Head Circumference --      Peak Flow --      Pain Score 08/15/16 0306 7     Pain Loc --      Pain Edu? --      Excl. in GC? --     Constitutional: Alert and oriented. Well appearing and in no apparent distress. HEENT:      Head: Normocephalic and atraumatic.         Eyes: Conjunctivae are normal. Sclera is non-icteric. EOMI. PERRL      Mouth/Throat: Mucous membranes are dry.       Neck: Supple with no signs of meningismus. Cardiovascular: Tachycardic with regular rhythm. No murmurs, gallops, or rubs. 2+ symmetrical distal pulses are present in all extremities. No JVD. Respiratory: Normal respiratory effort. Lungs are clear to auscultation bilaterally. No wheezes, crackles, or rhonchi.  Gastrointestinal: Soft, non tender, and non distended with positive bowel sounds. No rebound or guarding. Genitourinary: No CVA tenderness. Musculoskeletal: Nontender with normal range of motion in all extremities. No edema, cyanosis, or erythema of extremities. Neurologic: Normal speech and language. Face is symmetric. Moving all extremities. No gross focal neurologic deficits are appreciated. Skin: Skin is warm, dry and intact. No rash noted. Psychiatric: Mood and affect are normal. Speech and behavior are normal.  ____________________________________________   LABS (all labs ordered are listed, but only abnormal results are displayed)  Labs Reviewed  COMPREHENSIVE METABOLIC PANEL - Abnormal; Notable for the following:       Result Value   Potassium 3.3 (*)    Glucose, Bld 103 (*)    All other components within normal limits  CBC - Abnormal; Notable for the following:    RBC 6.06 (*)    All other components within normal limits  URINALYSIS, COMPLETE (UACMP) WITH MICROSCOPIC - Abnormal; Notable for the following:    Color, Urine YELLOW (*)      APPearance CLEAR (*)    Ketones, ur 5 (*)    Protein, ur 30 (*)    Squamous Epithelial / LPF 0-5 (*)    All other components within normal limits  LIPASE, BLOOD  TROPONIN I  TROPONIN I   ____________________________________________  EKG  ED ECG REPORT I, Nita Sickle, the attending physician, personally viewed and interpreted this ECG.  Sinus tachycardia, rate of 110, normal intervals, normal axis, T-wave inversions on the inferior and lateral leads, no ST elevation. No prior for comparison.  12:25 - sinus tachycardia, rate of 122, normal intervals, normal axis, T-wave inversions on inferior lateral leads. Unchanged from prior. ____________________________________________  RADIOLOGY  CXR: Negative ____________________________________________   PROCEDURES  Procedure(s) performed: None Procedures Critical Care performed:  None ____________________________________________   INITIAL IMPRESSION / ASSESSMENT AND PLAN / ED COURSE  21 y.o. male who presents for evaluation of vomiting and epigastric abdominal pain. Patient has no abdominal pain at this time. He looks dry on exam with dry mucous membranes and a heart  rate of 110. Abdomen is soft and nontender throughout. EKG shows diffuse T-wave inversions on inferior and lateral leads with no prior for comparison. Low suspicion for ACS or myocarditis however we'll cycle cardiac enzymes and repeat EKG once HR normalizes. We'll give IV fluids and IV Zofran. We'll check electrolytes and kidney function.  Clinical Course as of Aug 15 1514  Thu Aug 15, 2016  1444 Patient received 2 L of fluid and remains tachycardic with heart rate in the 120s. He tells me that he feels much better, has had no further episodes of vomiting the emergency room. No diarrhea. Patient denies chest pain, shortness of breath, abdominal pain. Serial abdominal exam showing no tenderness to palpation. Troponin 2 negative and that was done because patient has  T-wave inversions in inferior and lateral leads. I am planning to repeat his EKG once his tachycardia improves however with 2 normal troponins and no fever I do not believe this is myocarditis. We'll give a third bag of fluid at this time. Patient's temperature is 62F which could be contributing to the tachycardia. We'll give Tylenol. Plan to reassess vital signs after third liter and Tylenol. Care transferred to Dr. Shaune Pollack.  [CV]    Clinical Course User Index [CV] Nita Sickle, MD    Pertinent labs & imaging results that were available during my care of the patient were reviewed by me and considered in my medical decision making (see chart for details).    ____________________________________________   FINAL CLINICAL IMPRESSION(S) / ED DIAGNOSES  Final diagnoses:  Non-intractable vomiting with nausea, unspecified vomiting type  Epigastric abdominal pain      NEW MEDICATIONS STARTED DURING THIS VISIT:  New Prescriptions   No medications on file     Note:  This document was prepared using Dragon voice recognition software and may include unintentional dictation errors.    Nita Sickle, MD 08/15/16 1517

## 2016-08-15 NOTE — ED Notes (Signed)
Report given to Laura, RN.

## 2016-08-15 NOTE — ED Notes (Signed)
This RN spoke with MD regarding repeat EKG. Per MD repeat EKG is to be performed when patient has completed 2nd liter of NS.

## 2016-08-15 NOTE — ED Provider Notes (Addendum)
[facility]  I accepted care from Dr. Don PerkingVeronese ____________________________________________    LABS (pertinent positives/negatives)  Labs Reviewed  COMPREHENSIVE METABOLIC PANEL - Abnormal; Notable for the following:       Result Value   Potassium 3.3 (*)    Glucose, Bld 103 (*)    All other components within normal limits  CBC - Abnormal; Notable for the following:    RBC 6.06 (*)    All other components within normal limits  URINALYSIS, COMPLETE (UACMP) WITH MICROSCOPIC - Abnormal; Notable for the following:    Color, Urine YELLOW (*)    APPearance CLEAR (*)    Ketones, ur 5 (*)    Protein, ur 30 (*)    Squamous Epithelial / LPF 0-5 (*)    All other components within normal limits  LIPASE, BLOOD  TROPONIN I  TROPONIN I    ECG #3 118 bpm. On second heart appearing her chest and normal axis. Nonspecific ST and T-wave findings inferior laterally similar to prior EKGs today. ____________________________________________    RADIOLOGY All xrays were viewed by me. Imaging interpreted by radiologist.  CT chest with contrast angiogram for PE, CT abd and pelvis with contrast:  IMPRESSION: No evidence of central pulmonary embolus or other vascular abnormality.  No evidence of acute abnormalities within the chest, abdomen or pelvis.  ____________________________________________   PROCEDURES  Procedure(s) performed: None  Critical Care performed: None  ____________________________________________   INITIAL IMPRESSION / ASSESSMENT AND PLAN / ED COURSE   Pertinent labs & imaging results that were available during my care of the patient were reviewed by me and considered in my medical decision making (see chart for details).  Spoke with patient and his mom, I'm little concerned about his persistent tachycardia even after 3 L normal saline. He is a bit of a poor historian, and I'm not sure I'm fully trusting of his ability take tell his symptoms. His  repeat temperature is 99.1 orally. He has had some mild shortness of breath, although chest x-ray was reassuring. I discussed with them his abnormal heart rate, and we decided to proceed with a chest CT to rule out PE, as well as abdomen scan with contrast given the nausea vomiting and diarrhea, and persistent tachycardia to ensure no cardiopulmonary or intra-abdominal emergency medical condition.   Imaging reassuring. Patient feels well.  Ok for discharge.  Discussed recommend outpatient cardiology follow up with nonspecific ecg and tachycardia, but again no ongoing symptoms.  CONSULTATIONS: None    Patient / Family / Caregiver informed of clinical course, medical decision-making process, and agree with plan.   I discussed return precautions, follow-up instructions, and discharged instructions with patient and/or family.     ____________________________________________   FINAL CLINICAL IMPRESSION(S) / ED DIAGNOSES  Final diagnoses:  Non-intractable vomiting with nausea, unspecified vomiting type  Epigastric abdominal pain        Governor Rooksebecca Betty Daidone, MD 08/15/16 1752    Governor Rooksebecca Kesia Dalto, MD 08/15/16 [id number]

## 2016-08-15 NOTE — ED Notes (Signed)
Pt moved from room 16 to room 13. NAD noted at this time. Pt is alert and oriented. Will continue to monitor for further patient needs.

## 2016-08-15 NOTE — Discharge Instructions (Signed)
You were evaluated for vomiting and diarrhea, and your symptoms are improved here in the emergency department.  As we discussed, your heart rate has remained a little elevated here, but your exam and evaluation are reassuring here in the emergency department tonight.  I recommended a follow-up with primary care doctor, and [facility] office information and contact numbers are provided.  Given some slightly nonspecific findings on your EKG and elevated heart rate, I'm also recommending that you follow-up with a cardiologist, please call Dr. Windell HummingbirdGollan's office for an appointment.  Return to the emergency department immediately for any worsening symptoms including chest pain, abdominal pain, dizziness or passing out, fever, concern for dehydration such as dry mouth or not making urine, or any other symptoms concerning to you.

## 2016-08-15 NOTE — ED Triage Notes (Signed)
Pt ambulatory to triage with steady gait with c/o vomiting x 2 approx 20 min PTA and mid abdominal pain starting this am. Pt denies diarrhea or fever.

## 2016-08-16 ENCOUNTER — Telehealth: Payer: Self-pay

## 2016-08-16 NOTE — Telephone Encounter (Signed)
Called patient and he was not home  Spoke to his sister Will try again Patient needs to make FU appointment from ED

## 2016-08-19 NOTE — Telephone Encounter (Signed)
Lmov for patient to call back and schedule ED fu  ° °

## 2016-08-30 NOTE — Telephone Encounter (Signed)
Sent letter to patient to call us and schedule ED fu

## 2016-09-11 ENCOUNTER — Encounter: Payer: Self-pay | Admitting: Cardiology

## 2016-09-11 ENCOUNTER — Ambulatory Visit (INDEPENDENT_AMBULATORY_CARE_PROVIDER_SITE_OTHER): Payer: Medicaid Other | Admitting: Cardiology

## 2016-09-11 VITALS — BP 138/104 | HR 90 | Ht 69.0 in | Wt 211.5 lb

## 2016-09-11 DIAGNOSIS — R9431 Abnormal electrocardiogram [ECG] [EKG]: Secondary | ICD-10-CM

## 2016-09-11 NOTE — Patient Instructions (Addendum)
Testing/Procedures: Your physician has requested that you have an echocardiogram. Echocardiography is a painless test that uses sound waves to create images of your heart. It provides your doctor with information about the size and shape of your heart and how well your heart's chambers and valves are working. This procedure takes approximately one hour. There are no restrictions for this procedure.  Your physician has recommended that you wear a holter monitor. Holter monitors are medical devices that record the heart's electrical activity. Doctors most often use these monitors to diagnose arrhythmias. Arrhythmias are problems with the speed or rhythm of the heartbeat. The monitor is a small, portable device. You can wear one while you do your normal daily activities. This is usually used to diagnose what is causing palpitations/syncope (passing out).    Follow-Up: Your physician recommends that you schedule a follow-up appointment as needed with Dr. Ingal. We will call you with results and if needed schedule follow up at that time.   It was a pleasure seeing you today here in the office. Please do not hesitate to give us a call back if you have any further questions. [phone]  Pamela A. RN, BSN   Echocardiogram An echocardiogram, or echocardiography, uses sound waves (ultrasound) to produce an image of your heart. The echocardiogram is simple, painless, obtained within a short period of time, and offers valuable information to your health care provider. The images from an echocardiogram can provide information such as:  Evidence of coronary artery disease (CAD).  Heart size.  Heart muscle function.  Heart valve function.  Aneurysm detection.  Evidence of a past heart attack.  Fluid buildup around the heart.  Heart muscle thickening.  Assess heart valve function. Tell a health care provider about:  Any allergies you have.  All medicines you are taking, including vitamins,  herbs, eye drops, creams, and over-the-counter medicines.  Any problems you or family members have had with anesthetic medicines.  Any blood disorders you have.  Any surgeries you have had.  Any medical conditions you have.  Whether you are pregnant or may be pregnant. What happens before the procedure? No special preparation is needed. Eat and drink normally. What happens during the procedure?  In order to produce an image of your heart, gel will be applied to your chest and a wand-like tool (transducer) will be moved over your chest. The gel will help transmit the sound waves from the transducer. The sound waves will harmlessly bounce off your heart to allow the heart images to be captured in real-time motion. These images will then be recorded.  You may need an IV to receive a medicine that improves the quality of the pictures. What happens after the procedure? You may return to your normal schedule including diet, activities, and medicines, unless your health care provider tells you otherwise. This information is not intended to replace advice given to you by your health care provider. Make sure you discuss any questions you have with your health care provider. Document Released: 07/19/2000 Document Revised: 03/09/2016 Document Reviewed: 03/29/2013 Elsevier Interactive Patient Education  2017 [employer]  Holter Monitoring Introduction A Holter monitor is a small device that is used to detect abnormal heart rhythms. It clips to your clothing and is connected by wires to flat, sticky disks (electrodes) that attach to your chest. It is worn continuously for 24-48 hours. Follow these instructions at home:  Wear your Holter monitor at all times, even while exercising and sleeping, for as long as   directed by your health care provider.  Make sure that the Holter monitor is safely clipped to your clothing or close to your body as recommended by your health care provider.  Do not get  the monitor or wires wet.  Do not put body lotion or moisturizer on your chest.  Keep your skin clean.  Keep a diary of your daily activities, such as walking and doing chores. If you feel that your heartbeat is abnormal or that your heart is fluttering or skipping a beat:  Record what you are doing when it happens.  Record what time of day the symptoms occur.  Return your Holter monitor as directed by your health care provider.  Keep all follow-up visits as directed by your health care provider. This is important. Get help right away if:  You feel lightheaded or you faint.  You have trouble breathing.  You feel pain in your chest, upper arm, or jaw.  You feel sick to your stomach and your skin is pale, cool, or damp.  You heartbeat feels unusual or abnormal. This information is not intended to replace advice given to you by your health care provider. Make sure you discuss any questions you have with your health care provider. Document Released: 04/19/2004 Document Revised: 12/28/2015 Document Reviewed: 02/28/2014  2017 Elsevier  

## 2016-09-11 NOTE — Progress Notes (Signed)
Cardiology Office Note   Date:  09/11/2016   ID:  Fred Harris, DOB 01/04/1996, MRN [id number]  Referring Doctor:  No PCP Per Patient ER  Cardiologist:   Almond Lint, MD   Reason for consultation:  Chief Complaint  Patient presents with  . other     New Patient. [facility] ED fu for tachycardia Pt states he is doing well. Reviewed meds with pt verbally.      History of Present Illness: Fred Harris is a 21 y.o. male who presents for Evaluation of tachycardia Patient was in the ER on 08/15/2016 for evaluation of vomiting. His heart rate was 110, sinus tachycardia. Patient is sent here for further evaluation of the tachycardia and nonspecific changes on EKG. He was told that he had some mild infection at that time with a low-grade fever.  Patient is a poor historian but per mother, he has had no issues with chest pain or shortness of breath. He is out of school but he plays basketball regularly and has no symptoms of chest pain or shortness of breath. No episodes ever of loss of consciousness, dizziness or passing out. No sudden cardiac death in the family that he is aware of. No palpitations. No Recurrence of vomiting.  ROS:  Please see the history of present illness. Aside from mentioned under HPI, all other systems are reviewed and negative.     Past Medical History:  Diagnosis Date  . Asthma     History reviewed. No pertinent surgical history.   reports that he has never smoked. He has never used smokeless tobacco. He reports that he does not drink alcohol or use drugs.   family history includes Asthma in his mother; Cancer in his paternal grandfather; Hypertension in his maternal grandmother and paternal grandfather.   Outpatient Medications Prior to Visit  Medication Sig Dispense Refill  . ibuprofen (ADVIL,MOTRIN) 800 MG tablet Take 1 tablet (800 mg total) by mouth every 8 (eight) hours as needed for moderate pain. 30 tablet 0  . albuterol (PROVENTIL HFA;VENTOLIN  HFA) 108 (90 BASE) MCG/ACT inhaler Inhale 2 puffs into the lungs every 6 (six) hours as needed for wheezing or shortness of breath. (Patient not taking: Reported on 09/11/2016) 1 Inhaler 0  . benzonatate (TESSALON PERLES) 100 MG capsule Take 1 capsule (100 mg total) by mouth 3 (three) times daily as needed for cough (Take 1-2 per dose). (Patient not taking: Reported on 09/11/2016) 30 capsule 0  . brompheniramine-pseudoephedrine-DM 30-2-10 MG/5ML syrup Take 5 mLs by mouth 4 (four) times daily as needed. (Patient not taking: Reported on 09/11/2016) 120 mL 0  . cyclobenzaprine (FLEXERIL) 10 MG tablet Take 1 tablet (10 mg total) by mouth every 8 (eight) hours as needed for muscle spasms. (Patient not taking: Reported on 09/11/2016) 15 tablet 0  . hydrOXYzine (VISTARIL) 25 MG capsule Take 1 capsule (25 mg total) by mouth 3 (three) times daily as needed for itching. (Patient not taking: Reported on 09/11/2016) 21 capsule 0  . promethazine-dextromethorphan (PROMETHAZINE-DM) 6.25-15 MG/5ML syrup Take 5 mLs by mouth 4 (four) times daily as needed for cough. (Patient not taking: Reported on 09/11/2016) 118 mL 0  . ranitidine (ZANTAC) 150 MG tablet Take 1 tablet (150 mg total) by mouth 2 (two) times daily. (Patient not taking: Reported on 09/11/2016) 20 tablet 0   No facility-administered medications prior to visit.      Allergies: Patient has no known allergies.    PHYSICAL EXAM: VS:  BP Marland Kitchen)  138/104 (BP Location: Right Arm, Patient Position: Sitting, Cuff Size: Normal)   Pulse 90   Ht 5\' 9"  (1.753 m)   Wt 211 lb 8 oz (95.9 kg)   BMI 31.23 kg/m  , Body mass index is 31.23 kg/m. Wt Readings from Last 3 Encounters:  09/11/16 211 lb 8 oz (95.9 kg)  08/15/16 21 lb 9.6 oz (9.798 kg)  02/13/16 215 lb (97.5 kg)    GENERAL:  well developed, well nourished, obese, not in acute distress HEENT: normocephalic, pink conjunctivae, anicteric sclerae, no xanthelasma, normal dentition, oropharynx clear NECK:  no neck vein  engorgement, JVP normal, no hepatojugular reflux, carotid upstroke brisk and symmetric, no bruit, no thyromegaly, no lymphadenopathy LUNGS:  good respiratory effort, clear to auscultation bilaterally CV:  PMI not displaced, no thrills, no lifts, S1 and S2 within normal limits, no palpable S3 or S4, no murmurs, no rubs, no gallops ABD:  Soft, nontender, nondistended, normoactive bowel sounds, no abdominal aortic bruit, no hepatomegaly, no splenomegaly MS: nontender back, no kyphosis, no scoliosis, no joint deformities EXT:  2+ DP/PT pulses, no edema, no varicosities, no cyanosis, no clubbing SKIN: warm, nondiaphoretic, normal turgor, no ulcers NEUROPSYCH: alert, oriented to person, place, and time, sensory/motor grossly intact, normal mood, appropriate affect  Recent Labs: 08/15/2016: ALT 30; BUN 15; Creatinine, Ser 0.89; Hemoglobin 16.9; Platelets 216; Potassium 3.3; Sodium 140   Lipid Panel No results found for: CHOL, TRIG, HDL, CHOLHDL, VLDL, LDLCALC, LDLDIRECT   Other studies Reviewed:  EKG:  The ekg from 09/11/2016 was personally reviewed by me and it revealed sinus rhythm, 90 BPM, nonspecific ST-T changes. QTC is minimally prolonged 462ms.  Additional studies/ records that were reviewed personally reviewed by me today include: None available   ASSESSMENT AND PLAN: Tachycardia, heart rate is improved. Nonspecific ST-T wave changes, Slightly prolonged QT However, patient is completely asymptomatic. No chest pain, shortness of breath, palpitations, loss of consciousness, syncope. Patient denies illicit drug use. No over-the-counter medications. Recommend echocardiogram. Recommend 24-hour Holter monitor Recommend repeat potassium and TSH and free T4.  Elevated blood pressure. Patient appears to be anxious. Recommend blood pressure log.   Current medicines are reviewed at length with the patient today.  The patient does not have concerns regarding medicines.  Labs/ tests ordered  today include:  Orders Placed This Encounter  Procedures  . TSH  . Potassium  . T4, free  . Holter monitor - 24 hour  . EKG 12-Lead  . ECHOCARDIOGRAM COMPLETE    I had a lengthy and detailed discussion with the patient regarding diagnoses, prognosis, diagnostic options  Disposition:   FU with undersigned after tests   Thank you for this consultation. We will forwarding this consultation to referring physician.   Signed, Almond LintAileen Glendale Youngblood, MD  09/11/2016 3:59 PM    [facility]  This note was generated in part with voice recognition software and I apologize for any typographical errors that were not detected and corrected.

## 2016-09-12 LAB — TSH: TSH: 0.855 u[IU]/mL (ref 0.450–4.500)

## 2016-09-12 LAB — POTASSIUM: POTASSIUM: 4.2 mmol/L (ref 3.5–5.2)

## 2016-09-12 LAB — T4, FREE: FREE T4: 1.43 ng/dL (ref 0.82–1.77)

## 2016-10-03 ENCOUNTER — Other Ambulatory Visit: Payer: Medicaid Other

## 2016-10-03 ENCOUNTER — Encounter: Payer: Self-pay | Admitting: *Deleted

## 2018-07-09 IMAGING — CR DG CHEST 2V
2 series · 2 of 2 positions shown · non-contrast
Comparison: June 07, 2015

CLINICAL DATA: Tachycardia since 3 a.m.

EXAM:
CHEST  2 VIEW

[chest pa]
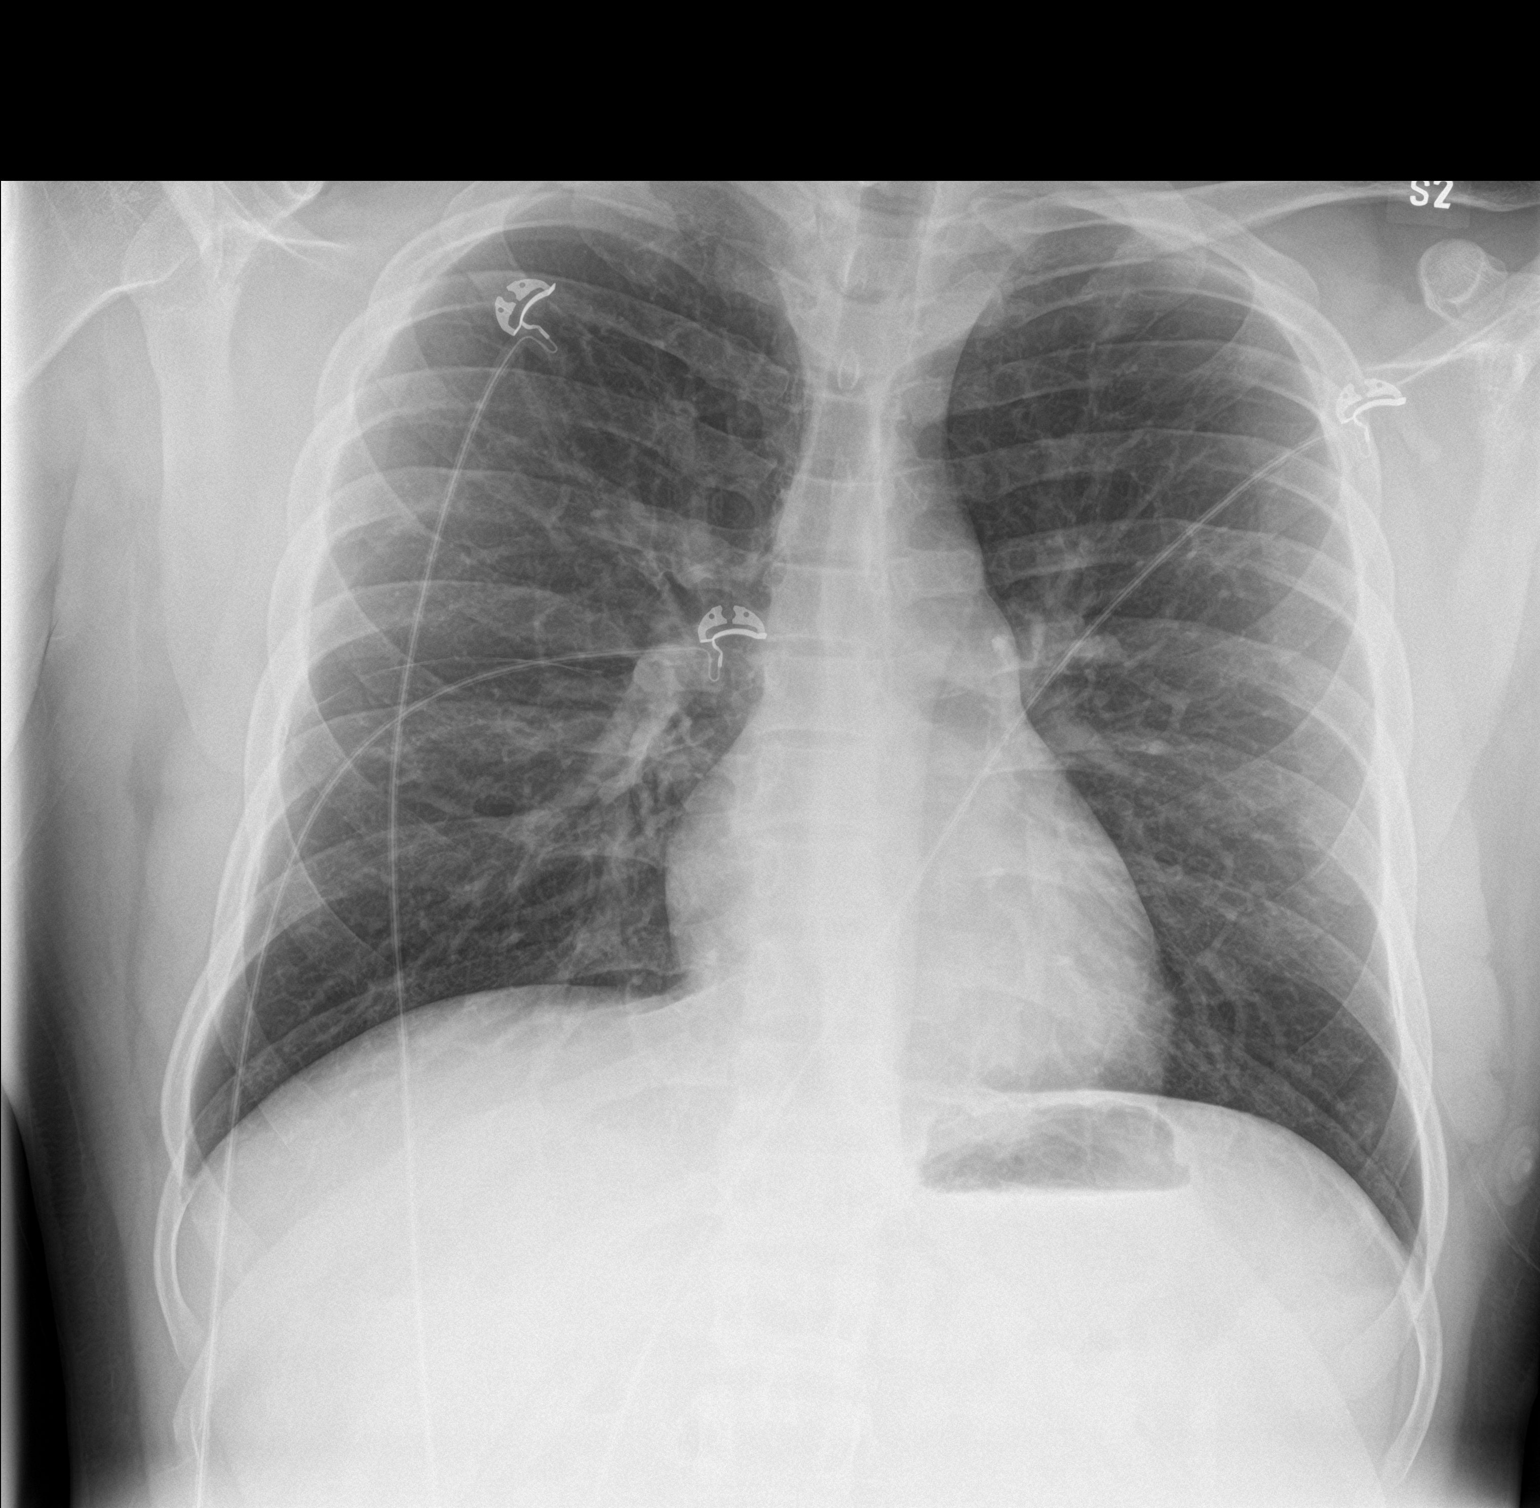

[chest lat]
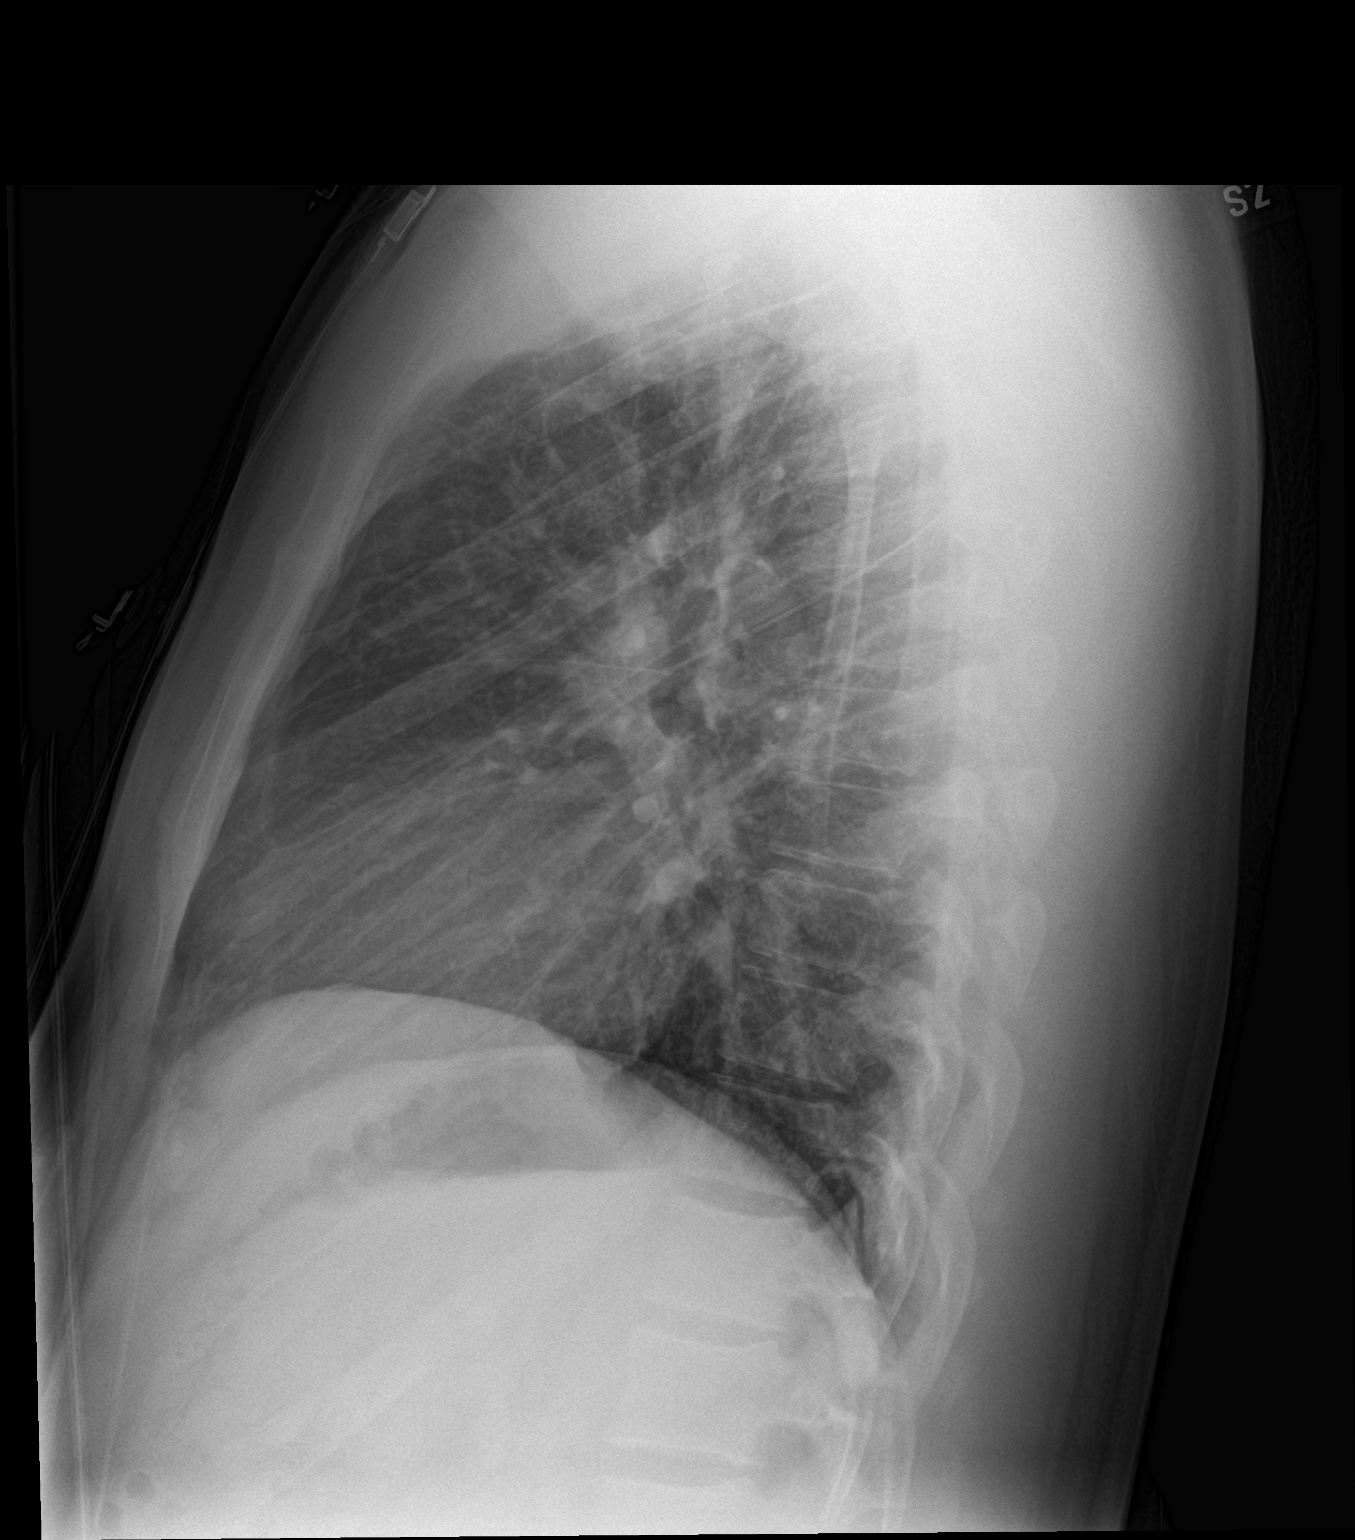

[2 of 2 positions shown; findings below may reference images not displayed]

FINDINGS: The heart size and mediastinal contours are within normal limits.
There is no focal infiltrate, pulmonary edema, or pleural effusion.
The visualized skeletal structures are stable.
IMPRESSION: No active cardiopulmonary disease.

## 2019-12-12 ENCOUNTER — Emergency Department: Payer: Medicaid Other

## 2019-12-12 ENCOUNTER — Encounter: Payer: Self-pay | Admitting: Emergency Medicine

## 2019-12-12 ENCOUNTER — Emergency Department
Admission: EM | Admit: 2019-12-12 | Discharge: 2019-12-12 | Disposition: A | Discharge status: Home / Self Care | Payer: Medicaid Other | Attending: Emergency Medicine | Admitting: Emergency Medicine

## 2019-12-12 ENCOUNTER — Other Ambulatory Visit: Payer: Self-pay

## 2019-12-12 DIAGNOSIS — R000 Tachycardia, unspecified: Secondary | ICD-10-CM | POA: Diagnosis not present

## 2019-12-12 DIAGNOSIS — J189 Pneumonia, unspecified organism: Secondary | ICD-10-CM | POA: Diagnosis not present

## 2019-12-12 DIAGNOSIS — Z20822 Contact with and (suspected) exposure to covid-19: Secondary | ICD-10-CM | POA: Diagnosis not present

## 2019-12-12 DIAGNOSIS — J45909 Unspecified asthma, uncomplicated: Secondary | ICD-10-CM | POA: Diagnosis not present

## 2019-12-12 DIAGNOSIS — R4182 Altered mental status, unspecified: Secondary | ICD-10-CM | POA: Diagnosis present

## 2019-12-12 LAB — URINALYSIS, COMPLETE (UACMP) WITH MICROSCOPIC
Bacteria, UA: NONE SEEN
Bilirubin Urine: NEGATIVE
Glucose, UA: NEGATIVE mg/dL
Hgb urine dipstick: NEGATIVE
Ketones, ur: 80 mg/dL — AB
Leukocytes,Ua: NEGATIVE
Nitrite: NEGATIVE
Protein, ur: NEGATIVE mg/dL
Specific Gravity, Urine: 1.021 (ref 1.005–1.030)
Squamous Epithelial / LPF: NONE SEEN (ref 0–5)
pH: 6 (ref 5.0–8.0)

## 2019-12-12 LAB — CBC WITH DIFFERENTIAL/PLATELET
Abs Immature Granulocytes: 0.04 10*3/uL (ref 0.00–0.07)
Basophils Absolute: 0 10*3/uL (ref 0.0–0.1)
Basophils Relative: 0 %
Eosinophils Absolute: 0 10*3/uL (ref 0.0–0.5)
Eosinophils Relative: 0 %
HCT: 49.5 % (ref 39.0–52.0)
Hemoglobin: 17.1 g/dL — ABNORMAL HIGH (ref 13.0–17.0)
Immature Granulocytes: 0 %
Lymphocytes Relative: 13 %
Lymphs Abs: 1.9 10*3/uL (ref 0.7–4.0)
MCH: 27.9 pg (ref 26.0–34.0)
MCHC: 34.5 g/dL (ref 30.0–36.0)
MCV: 80.8 fL (ref 80.0–100.0)
Monocytes Absolute: 1.2 10*3/uL — ABNORMAL HIGH (ref 0.1–1.0)
Monocytes Relative: 8 %
Neutro Abs: 11.7 10*3/uL — ABNORMAL HIGH (ref 1.7–7.7)
Neutrophils Relative %: 79 %
Platelets: 256 10*3/uL (ref 150–400)
RBC: 6.13 MIL/uL — ABNORMAL HIGH (ref 4.22–5.81)
RDW: 12.6 % (ref 11.5–15.5)
WBC: 14.9 10*3/uL — ABNORMAL HIGH (ref 4.0–10.5)
nRBC: 0 % (ref 0.0–0.2)

## 2019-12-12 LAB — COMPREHENSIVE METABOLIC PANEL
ALT: 23 U/L (ref 0–44)
AST: 32 U/L (ref 15–41)
Albumin: 5 g/dL (ref 3.5–5.0)
Alkaline Phosphatase: 57 U/L (ref 38–126)
Anion gap: 11 (ref 5–15)
BUN: 14 mg/dL (ref 6–20)
CO2: 24 mmol/L (ref 22–32)
Calcium: 9.2 mg/dL (ref 8.9–10.3)
Chloride: 104 mmol/L (ref 98–111)
Creatinine, Ser: 1.04 mg/dL (ref 0.61–1.24)
GFR calc Af Amer: >60 mL/min (ref >60)
GFR calc non Af Amer: >60 mL/min (ref >60)
Glucose, Bld: 95 mg/dL (ref 70–99)
Potassium: 4.8 mmol/L (ref 3.5–5.1)
Sodium: 139 mmol/L (ref 135–145)
Total Bilirubin: 0.8 mg/dL (ref 0.3–1.2)
Total Protein: 7.6 g/dL (ref 6.5–8.1)

## 2019-12-12 LAB — URINE DRUG SCREEN, QUALITATIVE (ARMC ONLY)
Amphetamines, Ur Screen: NOT DETECTED
Barbiturates, Ur Screen: NOT DETECTED
Benzodiazepine, Ur Scrn: NOT DETECTED
Cannabinoid 50 Ng, Ur ~~LOC~~: NOT DETECTED
Cocaine Metabolite,Ur ~~LOC~~: NOT DETECTED
MDMA (Ecstasy)Ur Screen: NOT DETECTED
Methadone Scn, Ur: NOT DETECTED
Opiate, Ur Screen: NOT DETECTED
Phencyclidine (PCP) Ur S: NOT DETECTED
Tricyclic, Ur Screen: NOT DETECTED

## 2019-12-12 LAB — BRAIN NATRIURETIC PEPTIDE: B Natriuretic Peptide: 16 pg/mL (ref 0.0–100.0)

## 2019-12-12 LAB — PROCALCITONIN: Procalcitonin: 0.21 ng/mL

## 2019-12-12 LAB — ETHANOL: Alcohol, Ethyl (B): <10 mg/dL (ref <10)

## 2019-12-12 LAB — CK: Total CK: 925 U/L — ABNORMAL HIGH (ref 49–397)

## 2019-12-12 LAB — RESPIRATORY PANEL BY RT PCR (FLU A&B, COVID)
Influenza A by PCR: NEGATIVE
Influenza B by PCR: NEGATIVE
SARS Coronavirus 2 by RT PCR: NEGATIVE

## 2019-12-12 LAB — ACETAMINOPHEN LEVEL: Acetaminophen (Tylenol), Serum: <10 ug/mL — ABNORMAL LOW (ref 10–30)

## 2019-12-12 LAB — SALICYLATE LEVEL: Salicylate Lvl: <7 mg/dL — ABNORMAL LOW (ref 7.0–30.0)

## 2019-12-12 MED ORDER — SODIUM CHLORIDE 0.9 % IV BOLUS: 1000.0000 mL | Freq: Once | INTRAVENOUS | Status: DC

## 2019-12-12 MED ORDER — DOXYCYCLINE MONOHYDRATE 100 MG PO TABS: 100.0000 mg | ORAL_TABLET | Freq: Two times a day (BID) | ORAL | 0 refills | Status: AC

## 2019-12-12 MED ORDER — SODIUM CHLORIDE 0.9 % IV BOLUS
1000.0000 mL | Freq: Once | INTRAVENOUS | Status: AC
  Administered 2019-12-12: 1000 mL via INTRAVENOUS

## 2019-12-12 MED ORDER — DOXYCYCLINE HYCLATE 100 MG PO TABS
100.0000 mg | ORAL_TABLET | Freq: Once | ORAL | Status: AC
  Administered 2019-12-12: 100 mg via ORAL
  Filled 2019-12-12: qty 1

## 2019-12-12 NOTE — ED Triage Notes (Signed)
Pt to ED via [facility] from the side of the road. Pt found in someone's front yard. Pt is altered. Per EMS, home owner called because pt was walking around in their yard. Pt was unsure how he got there or how long he had been there. Upon arrival to ED pt is A & O x 1, pt is repeating "Yup" when asked questions. Pt is tachycardic. Pt is calm and cooperative at this time and does not appear to be in distress.

## 2019-12-12 NOTE — ED Notes (Signed)
ED Provider at bedside. 

## 2019-12-12 NOTE — ED Provider Notes (Signed)
[facility] Emergency Department Provider Note  ____________________________________________   First MD Initiated Contact with Patient 12/12/19 [phone]     (approximate)  I have reviewed the triage vital signs and the nursing notes.   HISTORY  Chief Complaint Altered Mental Status    HPI Fred Harris is a 24 y.o. male with history of asthma, possible intellectual disability?  Who comes in after being found in yard.  Patient was found walking around by the home owner.  EMS was called.  Patient unsure how he got there or how long he had been there.  Patient is only oriented x1.  Patient just keeps repeating "yeah "with questions.  Unable to get full HPI due to patient's mental status.  Level 5 caveat          Past Medical History:  Diagnosis Date  . Asthma     There are no problems to display for this patient.   No past surgical history on file.  Prior to Admission medications   Medication Sig Start Date End Date Taking? Authorizing Provider  ibuprofen (ADVIL,MOTRIN) 800 MG tablet Take 1 tablet (800 mg total) by mouth every 8 (eight) hours as needed for moderate pain. 12/15/14   Johnn Hai, PA-C    Allergies Patient has no known allergies.  Family History  Problem Relation Age of Onset  . Asthma Mother   . Hypertension Maternal Grandmother   . Hypertension Paternal Grandfather   . Cancer Paternal Grandfather     Social History Social History   Tobacco Use  . Smoking status: Never Smoker  . Smokeless tobacco: Never Used  Substance Use Topics  . Alcohol use: No  . Drug use: No      Review of Systems Constitutional: No fever/chills Eyes: No visual changes. ENT: No sore throat. Cardiovascular: Denies chest pain. Respiratory: Denies shortness of breath. Gastrointestinal: No abdominal pain.  No nausea, no vomiting.  No diarrhea.  No constipation. Genitourinary: Negative for dysuria. Musculoskeletal: Negative for back  pain. Skin: Negative for rash. Neurological: Negative for headaches, focal weakness or numbness. Patient denies any symptoms but his history is limited due to his mental status, level 5 caveat ____________________________________________   PHYSICAL EXAM:  VITAL SIGNS: Blood pressure (!) 161/105, pulse (!) 131, temperature 98.3 F (36.8 C), resp. rate (!) 23, SpO2 97 %.   Constitutional: Alert and oriented x1. Well appearing and in no acute distress. Eyes: Conjunctivae are normal. EOMI. Head: Atraumatic. Nose: No congestion/rhinnorhea. Mouth/Throat: Mucous membranes are moist.  Poor dentition Neck: No stridor. Trachea Midline. FROM Cardiovascular: Tachycardic, regular rhythm. Grossly normal heart sounds.  Good peripheral circulation. Respiratory: Normal respiratory effort.  No retractions. Lungs CTAB. Gastrointestinal: Soft and nontender. No distention. No abdominal bruits.  Musculoskeletal: No lower extremity tenderness nor edema.  No joint effusions.  Neurologic: Patient able to squeeze bilateral hands and lift up bilateral legs.  Oriented x1.  Responds yes to most questions Skin: Skin feels slightly cold but no rashes or trauma noted over the arms or legs Psychiatric: Unable to fully assess due to mental status GU: Deferred   ____________________________________________   LABS (all labs ordered are listed, but only abnormal results are displayed)  Labs Reviewed  CBC WITH DIFFERENTIAL/PLATELET - Abnormal; Notable for the following components:      Result Value   WBC 14.9 (*)    RBC 6.13 (*)    Hemoglobin 17.1 (*)    Neutro Abs 11.7 (*)    Monocytes Absolute  1.2 (*)    All other components within normal limits  CK - Abnormal; Notable for the following components:   Total CK 925 (*)    All other components within normal limits  SALICYLATE LEVEL - Abnormal; Notable for the following components:   Salicylate Lvl <7.0 (*)    All other components within normal limits    ACETAMINOPHEN LEVEL - Abnormal; Notable for the following components:   Acetaminophen (Tylenol), Serum <10 (*)    All other components within normal limits  RESPIRATORY PANEL BY RT PCR (FLU A&B, COVID)  COMPREHENSIVE METABOLIC PANEL  ETHANOL  BRAIN NATRIURETIC PEPTIDE  PROCALCITONIN  URINALYSIS, COMPLETE (UACMP) WITH MICROSCOPIC  URINE DRUG SCREEN, QUALITATIVE ([facility] ONLY)   ____________________________________________   ED ECG REPORT I, Fred Harris, the attending physician, personally viewed and interpreted this ECG.  EKG was sinus tachycardia rate of 127, no ST elevations, T wave inversion in lead III, normal intervals ____________________________________________  RADIOLOGY Fred Harris, personally viewed and evaluated these images (plain radiographs) as part of my medical decision making, as well as reviewing the written report by the radiologist.  ED MD interpretation: Some mild airspace disease in the bases.  Official radiology report(s): CT Head Wo Contrast  Result Date: 12/12/2019 CLINICAL DATA:  Head trauma, headache. EXAM: CT HEAD WITHOUT CONTRAST CT CERVICAL SPINE WITHOUT CONTRAST TECHNIQUE: Multidetector CT imaging of the head and cervical spine was performed following the standard protocol without intravenous contrast. Multiplanar CT image reconstructions of the cervical spine were also generated. COMPARISON:  No pertinent prior studies available for comparison. FINDINGS: CT HEAD FINDINGS Brain: There is no acute intracranial hemorrhage. No demarcated cortical infarct. No extra-axial fluid collection. No evidence of intracranial mass. No midline shift. Vascular: No hyperdense vessel. Skull: Normal. Negative for fracture or focal lesion. Sinuses/Orbits: Visualized orbits show no acute finding. No significant paranasal sinus disease or mastoid effusion at the imaged levels. CT CERVICAL SPINE FINDINGS Alignment: Nonspecific reversal of the expected cervical lordosis Skull  base and vertebrae: The basion-dental and atlanto-dental intervals are maintained.No evidence of acute fracture to the cervical spine. Soft tissues and spinal canal: No prevertebral fluid or swelling. No visible canal hematoma. Disc levels: No significant bony spinal canal or neural foraminal narrowing at any level. Upper chest: The imaged lung apices are clear. No visible pneumothorax. IMPRESSION: CT head: No evidence of acute intracranial abnormality. CT cervical spine: No evidence of acute fracture to the cervical spine. Electronically Signed   By: Fred Loge DO   On: 12/12/2019 08:51   CT Cervical Spine Wo Contrast  Result Date: 12/12/2019 CLINICAL DATA:  Head trauma, headache. EXAM: CT HEAD WITHOUT CONTRAST CT CERVICAL SPINE WITHOUT CONTRAST TECHNIQUE: Multidetector CT imaging of the head and cervical spine was performed following the standard protocol without intravenous contrast. Multiplanar CT image reconstructions of the cervical spine were also generated. COMPARISON:  No pertinent prior studies available for comparison. FINDINGS: CT HEAD FINDINGS Brain: There is no acute intracranial hemorrhage. No demarcated cortical infarct. No extra-axial fluid collection. No evidence of intracranial mass. No midline shift. Vascular: No hyperdense vessel. Skull: Normal. Negative for fracture or focal lesion. Sinuses/Orbits: Visualized orbits show no acute finding. No significant paranasal sinus disease or mastoid effusion at the imaged levels. CT CERVICAL SPINE FINDINGS Alignment: Nonspecific reversal of the expected cervical lordosis Skull base and vertebrae: The basion-dental and atlanto-dental intervals are maintained.No evidence of acute fracture to the cervical spine. Soft tissues and spinal canal: No prevertebral fluid or  swelling. No visible canal hematoma. Disc levels: No significant bony spinal canal or neural foraminal narrowing at any level. Upper chest: The imaged lung apices are clear. No visible  pneumothorax. IMPRESSION: CT head: No evidence of acute intracranial abnormality. CT cervical spine: No evidence of acute fracture to the cervical spine. Electronically Signed   By: Fred Loge DO   On: 12/12/2019 08:51   DG Chest Portable 1 View  Result Date: 12/12/2019 CLINICAL DATA:  Vomiting. EXAM: PORTABLE CHEST 1 VIEW COMPARISON:  None. FINDINGS: Normal cardiac silhouette. There is fine airspace disease in lower lobes. No focal consolidation. New no pneumothorax. No acute osseous abnormality. IMPRESSION: Fine lower lobe airspace disease with differential including pneumonia versus pulmonary edema. Electronically Signed   By: Genevive Bi M.D.   On: 12/12/2019 09:16    ____________________________________________   PROCEDURES  Procedure(s) performed (including Critical Care):  .1-3 Lead EKG Interpretation Performed by: Fred Se, MD Authorized by: Fred Se, MD     Interpretation: abnormal     ECG rate:  130s    ECG rate assessment: tachycardic     Rhythm: sinus tachycardia     Conduction: normal       ____________________________________________   INITIAL IMPRESSION / ASSESSMENT AND PLAN / ED COURSE  RICHY SPRADLEY was evaluated in Emergency Department on 12/12/2019 for the symptoms described in the history of present illness. He was evaluated in the context of the global COVID-19 pandemic, which necessitated consideration that the patient might be at risk for infection with the SARS-CoV-2 virus that causes COVID-19. Institutional protocols and algorithms that pertain to the evaluation of patients at risk for COVID-19 are in a state of rapid change based on information released by regulatory bodies including the CDC and federal and state organizations. These policies and algorithms were followed during the patient's care in the ED.    Patient is a 24 year old who comes in after being found walking around outside.  Unclear patient's medical history.  The patient may  have some intellectual disability however reviewed prior notes from 2 years ago and there is no specific comment on this.  Given this will get CT head just to make sure no evidence of intracranial hemorrhage, CT cervical make sure no evidence of cervical fracture.  No other signs of trauma based upon examination.  Get labs to evaluate electrolyte abnormalities, CK to evaluate for rhabdo, AKI given patient's tachycardia.  We will keep patient on the cardiac monitoring fluid resuscitate patient.   8:50 AM discussed with patient's mother and dad.  Patient was listed as a missing person.  Patient was last seen last night and has been missing since 6:30 AM.  They are going to come to the ER to make sure that he is at his baseline self  Family is at bedside and states that he appears to be at his baseline self.  CK was slightly elevated at 925 but no signs of renal dysfunction patient is gotten fluids and is tolerating p.o. so I have low suspicion for acute rhabdo.  UA without RBCs in it  CT imaging of the head and neck were negative.  Patient is chest x-ray was concerning for pulmonary edema versus pneumonia.  Patient's Covid swab was negative.  BNP was negative no signs of fluid overload.  Patient's heart rate has come down greatly after the fluids from 130s to just slightly over 100.  His white count is elevated but his procalcitonin was not significantly elevated.  He denies any cough, shortness of breath, fevers.  However given the new white count and the chest x-ray I think would be safest is to prescribe a short course of antibiotics for possible pneumonia.  Patient was ambulated and he had no evidence of desaturation.  At this time I have low suspicion for sepsis or bacteremia given how well-appearing patient is he denies any symptoms.  Urine without evidence of UTI or rhabdo.  I also have low suspicion for PE given he is not having any shortness of breath and is not hypoxic.  He was seen 3 years ago  as well and had unexplained tachycardia and had negative CT PE at that time.  Discussed with mother and she feels comfortable taking patient back home on a course of antibiotics and to return to the ER if he develops worsening symptoms.         ____________________________________________   FINAL CLINICAL IMPRESSION(S) / ED DIAGNOSES   Final diagnoses:  Pneumonia of both lower lobes due to infectious organism      MEDICATIONS GIVEN DURING THIS VISIT:  Medications  doxycycline (VIBRA-TABS) tablet 100 mg (has no administration in time range)  sodium chloride 0.9 % bolus 1,000 mL (1,000 mLs Intravenous New Bag/Given 12/12/19 0912)     ED Discharge Orders    None       Note:  This document was prepared using Dragon voice recognition software and may include unintentional dictation errors.   Fred Se, MD 12/12/19 1204

## 2019-12-12 NOTE — ED Notes (Signed)
Mother at bedside. Mother given Sprite and crackers per request. IV fluids infusing. Pt in NAD.

## 2019-12-12 NOTE — Discharge Instructions (Addendum)
Patient's work-up was reassuring although there was concern for possible pneumonia on his x-ray.  Given he has a little bit elevation in his white count we will send patient with some antibiotics that can be started tomorrow morning.  You can return to ER if develops worsening shortness of breath, fevers or any other concerns otherwise you should follow-up with your primary care doctor in 2 days to make sure that he continues to do well   IMPRESSION:  Fine lower lobe airspace disease with differential including  pneumonia versus pulmonary edema.

## 2019-12-12 NOTE — ED Notes (Signed)
Mother at bedside.

## 2021-01-24 ENCOUNTER — Encounter: Payer: Self-pay | Admitting: Emergency Medicine

## 2021-01-24 ENCOUNTER — Emergency Department
Admission: EM | Admit: 2021-01-24 | Discharge: 2021-01-24 | Disposition: A | Discharge status: Home / Self Care | Payer: Medicaid Other | Attending: Emergency Medicine | Admitting: Emergency Medicine

## 2021-01-24 ENCOUNTER — Other Ambulatory Visit: Payer: Self-pay

## 2021-01-24 DIAGNOSIS — I10 Essential (primary) hypertension: Secondary | ICD-10-CM | POA: Insufficient documentation

## 2021-01-24 DIAGNOSIS — Z5321 Procedure and treatment not carried out due to patient leaving prior to being seen by health care provider: Secondary | ICD-10-CM | POA: Diagnosis not present

## 2021-01-24 NOTE — ED Triage Notes (Signed)
Pt comes into the ED via POV with his mother c/o possible HTN and COVID. Pt denies being around anyone that has COVID, but the mother would like him checked.  Pt's mother also just wants to make sure he doesn't have high blood pressure. Pt denies any problems.

## 2021-01-24 NOTE — ED Notes (Signed)
Pt to desk with mother and states they are leaving and will complete covid test at pharmacy. Mother states they do not wish to stay to be seen

## 2021-04-07 ENCOUNTER — Other Ambulatory Visit: Payer: Self-pay

## 2021-04-07 ENCOUNTER — Emergency Department
Admission: EM | Admit: 2021-04-07 | Discharge: 2021-04-07 | Disposition: A | Discharge status: Home / Self Care | Payer: Medicaid Other | Attending: Student in an Organized Health Care Education/Training Program | Admitting: Student in an Organized Health Care Education/Training Program

## 2021-04-07 DIAGNOSIS — J45909 Unspecified asthma, uncomplicated: Secondary | ICD-10-CM | POA: Diagnosis not present

## 2021-04-07 DIAGNOSIS — Z0000 Encounter for general adult medical examination without abnormal findings: Secondary | ICD-10-CM | POA: Diagnosis not present

## 2021-04-07 NOTE — ED Provider Notes (Signed)
[facility] Emergency Department Provider Note    Event Date/Time   First MD Initiated Contact with Patient 04/07/21 1733     (approximate)  I have reviewed the triage vital signs and the nursing notes.   HISTORY  Chief Complaint check up    HPI Fred Harris is a 25 y.o. male below listed presents to the ER for medical evaluation.  No specific complaints.  Mother to accompany the patient states he is history of disability but has been walking off more than usual.  Denies any nausea vomiting no chest pain or shortness of breath no new medications.  Denies any anxiety.  No history of schizophrenia.  No SI or HI.  States that she would like a referral for him to see PCP.  Past Medical History:  Diagnosis Date   Asthma    Family History  Problem Relation Age of Onset   Asthma Mother    Hypertension Maternal Grandmother    Hypertension Paternal Grandfather    Cancer Paternal Grandfather    History reviewed. No pertinent surgical history. There are no problems to display for this patient.     Prior to Admission medications   Medication Sig Start Date End Date Taking? Authorizing Provider  ibuprofen (ADVIL,MOTRIN) 800 MG tablet Take 1 tablet (800 mg total) by mouth every 8 (eight) hours as needed for moderate pain. 12/15/14   Tommi Rumps, PA-C    Allergies Patient has no known allergies.    Social History Social History   Tobacco Use   Smoking status: Never   Smokeless tobacco: Never  Substance Use Topics   Alcohol use: No   Drug use: No    Review of Systems Patient denies headaches, rhinorrhea, blurry vision, numbness, shortness of breath, chest pain, edema, cough, abdominal pain, nausea, vomiting, diarrhea, dysuria, fevers, rashes or hallucinations unless otherwise stated above in HPI. ____________________________________________   PHYSICAL EXAM:  VITAL SIGNS: Vitals:   04/07/21 1624  BP: (!) 153/83  Pulse: 88  Resp:  18  Temp: 98.7 F (37.1 C)  SpO2: 100%    Constitutional: Alert and oriented. Well appearing and in no acute distress. Eyes: Conjunctivae are normal.  Head: Atraumatic. Nose: No congestion/rhinnorhea. Mouth/Throat: Mucous membranes are moist.   Neck: Painless ROM.  Cardiovascular:   Good peripheral circulation. Respiratory: Normal respiratory effort.  No retractions.  Gastrointestinal: Soft and nontender.  Musculoskeletal: No lower extremity tenderness .  No joint effusions. Neurologic:  Normal speech and language. No gross focal neurologic deficits are appreciated.  Skin:  Skin is warm, dry and intact. No rash noted. Psychiatric: Mood and affect are normal. Speech and behavior are normal.  ____________________________________________   LABS (all labs ordered are listed, but only abnormal results are displayed)  No results found for this or any previous visit (from the past 24 hour(s)). ____________________________________________ ____________________________________________   PROCEDURES  Procedure(s) performed:  Procedures    Critical Care performed: no ____________________________________________   INITIAL IMPRESSION / ASSESSMENT AND PLAN / ED COURSE  Pertinent labs & imaging results that were available during my care of the patient were reviewed by me and considered in my medical decision making (see chart for details).   DDX: well check,  htn  Fred Harris is a 25 y.o. who presents to the ED with presentation as described above.  Patient clinically well-appearing mildly hypertensive he is asymptomatic not complaining of any discomfort.  Will give referral for outpatient follow-up.  Mother agreeable to plan.  ____________________________________________   FINAL CLINICAL IMPRESSION(S) / ED DIAGNOSES  Final diagnoses:  Well adult health check      NEW MEDICATIONS STARTED DURING THIS VISIT:  New Prescriptions   No medications on file      Note:  This document was prepared using Dragon voice recognition software and may include unintentional dictation errors.     Willy Eddy, MD 04/07/21 [phone]

## 2021-04-07 NOTE — ED Triage Notes (Signed)
Pt comes brought in by mom for "walking off". Mom states that he always does this and she just wants to make sure "He's okay". When asked to clarify, mom says "you know like check his blood pressure and like a check up". Pt denies any complaints or new symptoms. Mom denies that he has been "off" or any more confused than his baseline.

## 2021-04-07 NOTE — Discharge Instructions (Addendum)
Please follow up with PCP.

## 2021-07-18 ENCOUNTER — Other Ambulatory Visit: Payer: Self-pay

## 2021-07-18 ENCOUNTER — Emergency Department
Admission: EM | Admit: 2021-07-18 | Discharge: 2021-07-18 | Disposition: A | Discharge status: Home / Self Care | Payer: Medicaid Other | Attending: Emergency Medicine | Admitting: Emergency Medicine

## 2021-07-18 DIAGNOSIS — Z20822 Contact with and (suspected) exposure to covid-19: Secondary | ICD-10-CM | POA: Diagnosis not present

## 2021-07-18 DIAGNOSIS — J45909 Unspecified asthma, uncomplicated: Secondary | ICD-10-CM | POA: Diagnosis not present

## 2021-07-18 DIAGNOSIS — K529 Noninfective gastroenteritis and colitis, unspecified: Secondary | ICD-10-CM | POA: Diagnosis not present

## 2021-07-18 DIAGNOSIS — R109 Unspecified abdominal pain: Secondary | ICD-10-CM | POA: Diagnosis present

## 2021-07-18 LAB — CBC
HCT: 49.4 % (ref 39.0–52.0)
Hemoglobin: 16.7 g/dL (ref 13.0–17.0)
MCH: 27.1 pg (ref 26.0–34.0)
MCHC: 33.8 g/dL (ref 30.0–36.0)
MCV: 80.2 fL (ref 80.0–100.0)
Platelets: 209 10*3/uL (ref 150–400)
RBC: 6.16 MIL/uL — ABNORMAL HIGH (ref 4.22–5.81)
RDW: 13.2 % (ref 11.5–15.5)
WBC: 5.4 10*3/uL (ref 4.0–10.5)
nRBC: 0 % (ref 0.0–0.2)

## 2021-07-18 LAB — COMPREHENSIVE METABOLIC PANEL
ALT: 17 U/L (ref 0–44)
AST: 17 U/L (ref 15–41)
Albumin: 4.2 g/dL (ref 3.5–5.0)
Alkaline Phosphatase: 104 U/L (ref 38–126)
Anion gap: 6 (ref 5–15)
BUN: 11 mg/dL (ref 6–20)
CO2: 25 mmol/L (ref 22–32)
Calcium: 8.8 mg/dL — ABNORMAL LOW (ref 8.9–10.3)
Chloride: 105 mmol/L (ref 98–111)
Creatinine, Ser: 0.67 mg/dL (ref 0.61–1.24)
GFR, Estimated: >60 mL/min (ref >60)
Glucose, Bld: 94 mg/dL (ref 70–99)
Potassium: 3.9 mmol/L (ref 3.5–5.1)
Sodium: 136 mmol/L (ref 135–145)
Total Bilirubin: 1 mg/dL (ref 0.3–1.2)
Total Protein: 6.9 g/dL (ref 6.5–8.1)

## 2021-07-18 LAB — URINALYSIS, ROUTINE W REFLEX MICROSCOPIC
Bilirubin Urine: NEGATIVE
Glucose, UA: NEGATIVE mg/dL
Hgb urine dipstick: NEGATIVE
Ketones, ur: NEGATIVE mg/dL
Leukocytes,Ua: NEGATIVE
Nitrite: NEGATIVE
Protein, ur: NEGATIVE mg/dL
Specific Gravity, Urine: 1.02 (ref 1.005–1.030)
pH: 7 (ref 5.0–8.0)

## 2021-07-18 LAB — RESP PANEL BY RT-PCR (FLU A&B, COVID) ARPGX2
Influenza A by PCR: NEGATIVE
Influenza B by PCR: NEGATIVE
SARS Coronavirus 2 by RT PCR: NEGATIVE

## 2021-07-18 LAB — LIPASE, BLOOD: Lipase: 24 U/L (ref 11–51)

## 2021-07-18 NOTE — ED Triage Notes (Signed)
Pt to ED with mother for generalized abd pain and diarrhea that started last night. Denies emesis.  Nad noted Mother reports goes to daycare during day time, unsure if he has been around illness.

## 2021-07-18 NOTE — ED Provider Notes (Signed)
[facility] Emergency Department Provider Note  ____________________________________________   Event Date/Time   First MD Initiated Contact with Patient 07/18/21 1124     (approximate)  I have reviewed the triage vital signs and the nursing notes.   HISTORY  Chief Complaint Abdominal Pain   HPI Fred Harris is a 25 y.o. male with below noted past medical history presents accompanied by mother for assessment of some abdominal pain and diarrhea that occurred last night.  Seems to have been an isolated episode and was no associated blood or dark melanotic stools.  Patient states he is currently pain-free.  He denies any associated nausea or vomiting.  No fevers, chills, headache, earache, sore throat, chest pain, cough, shortness of breath, back pain, urinary symptoms rash or extremity pain.  No other acute concerns at this time.  Patient has been able to tolerate p.o. today without any difficulty.  He describes the pain is primarily around his umbilicus.         Past Medical History:  Diagnosis Date   Asthma     There are no problems to display for this patient.   No past surgical history on file.  Prior to Admission medications   Medication Sig Start Date End Date Taking? Authorizing Provider  ibuprofen (ADVIL,MOTRIN) 800 MG tablet Take 1 tablet (800 mg total) by mouth every 8 (eight) hours as needed for moderate pain. 12/15/14   Tommi Rumps, PA-C    Allergies Patient has no known allergies.  Family History  Problem Relation Age of Onset   Asthma Mother    Hypertension Maternal Grandmother    Hypertension Paternal Grandfather    Cancer Paternal Grandfather     Social History Social History   Tobacco Use   Smoking status: Never   Smokeless tobacco: Never  Substance Use Topics   Alcohol use: No   Drug use: No    Review of Systems  Review of Systems  Constitutional:  Negative for chills and fever.  HENT:  Negative for  sore throat.   Eyes:  Negative for pain.  Respiratory:  Negative for cough and stridor.   Cardiovascular:  Negative for chest pain.  Gastrointestinal:  Positive for abdominal pain and diarrhea. Negative for vomiting.  Skin:  Negative for rash.  Neurological:  Negative for seizures, loss of consciousness and headaches.  Psychiatric/Behavioral:  Negative for suicidal ideas.   All other systems reviewed and are negative.    ____________________________________________   PHYSICAL EXAM:  VITAL SIGNS: ED Triage Vitals  Enc Vitals Group     BP 07/18/21 1003 131/85     Pulse Rate 07/18/21 1003 73     Resp 07/18/21 1003 17     Temp 07/18/21 1003 98.9 F (37.2 C)     Temp Source 07/18/21 1003 Oral     SpO2 07/18/21 1003 98 %     Weight 07/18/21 1013 140 lb (63.5 kg)     Height 07/18/21 1013 5\' 7"  (1.702 m)     Head Circumference --      Peak Flow --      Pain Score 07/18/21 1013 3     Pain Loc --      Pain Edu? --      Excl. in GC? --    Vitals:   07/18/21 1003  BP: 131/85  Pulse: 73  Resp: 17  Temp: 98.9 F (37.2 C)  SpO2: 98%   Physical Exam Vitals and nursing note reviewed.  Constitutional:      General: He is not in acute distress.    Appearance: He is well-developed.  HENT:     Head: Normocephalic and atraumatic.     Right Ear: External ear normal.     Left Ear: External ear normal.     Nose: Nose normal.  Eyes:     Conjunctiva/sclera: Conjunctivae normal.  Cardiovascular:     Rate and Rhythm: Normal rate and regular rhythm.     Heart sounds: No murmur heard. Pulmonary:     Effort: Pulmonary effort is normal. No respiratory distress.     Breath sounds: Normal breath sounds.  Abdominal:     Palpations: Abdomen is soft.     Tenderness: There is no abdominal tenderness.  Musculoskeletal:        General: No swelling.     Cervical back: Neck supple.  Skin:    General: Skin is warm and dry.     Capillary Refill: Capillary refill takes less than 2 seconds.   Neurological:     Mental Status: He is alert and oriented to person, place, and time.  Psychiatric:        Mood and Affect: Mood normal.     ____________________________________________   LABS (all labs ordered are listed, but only abnormal results are displayed)  Labs Reviewed  COMPREHENSIVE METABOLIC PANEL - Abnormal; Notable for the following components:      Result Value   Calcium 8.8 (*)    All other components within normal limits  CBC - Abnormal; Notable for the following components:   RBC 6.16 (*)    All other components within normal limits  RESP PANEL BY RT-PCR (FLU A&B, COVID) ARPGX2  LIPASE, BLOOD  URINALYSIS, ROUTINE W REFLEX MICROSCOPIC   ____________________________________________  EKG  ____________________________________________  RADIOLOGY  ED MD interpretation:    Official radiology report(s): No results found.  ____________________________________________   PROCEDURES  Procedure(s) performed (including Critical Care):  Procedures   ____________________________________________   INITIAL IMPRESSION / ASSESSMENT AND PLAN / ED COURSE      Patient with above-stated history exam coming by mother for assessment of some crampy umbilical abdominal pain associate with nonbloody diarrhea that occurred last night and has since resolved.  Patient afebrile hemodynamically stable on arrival.  His abdomen is soft nontender throughout and he has no CVA tenderness.  He does not appear dehydrated.  I suspect likely an acute infectious enteritis.  Patient does not appear septic or significantly dehydrated has been able tolerate p.o. without difficulty.  CMP without significant electrolyte or metabolic derangements.  There is no evidence of hepatitis or cholestasis.  Lipase not consistent with pancreatitis.  CBC shows no leukocytosis or significant acute anemia.  UA is not suggestive of cystitis.  Will obtain an influenza and COVID PCR and advised patient and  mother they can follow this result up with PCP if it is positive.  At this time I have a low suspicion for appendicitis, kidney stone, AAA or other immediate life-threatening process.  Given stable vitals with resolution of symptoms and reassuring work-up and exam I think is stable for discharge with outpatient follow-up as needed.  Discharged in stable condition.  Strict return precautions advised and discussed.        ____________________________________________   FINAL CLINICAL IMPRESSION(S) / ED DIAGNOSES  Final diagnoses:  Enteritis    Medications - No data to display   ED Discharge Orders     None  Note:  This document was prepared using Dragon voice recognition software and may include unintentional dictation errors.    Gilles Chiquito, MD 07/18/21 [phone]

## 2021-11-04 IMAGING — CT CT HEAD W/O CM
3 series · 15 of 47 positions shown, 18 images · non-contrast
Comparison: No pertinent prior studies available for comparison.

CLINICAL DATA: Head trauma, headache.

EXAM:
CT HEAD WITHOUT CONTRAST
CT CERVICAL SPINE WITHOUT CONTRAST
TECHNIQUE: Multidetector CT imaging of the head and cervical spine was
performed following the standard protocol without intravenous
contrast. Multiplanar CT image reconstructions of the cervical spine
were also generated.

[Series 3: head wo · axial · 0.39mm/px · z∈[-151,-26]mm · 9 of 31 slices shown, 12 images]
[im 3/31  brain]
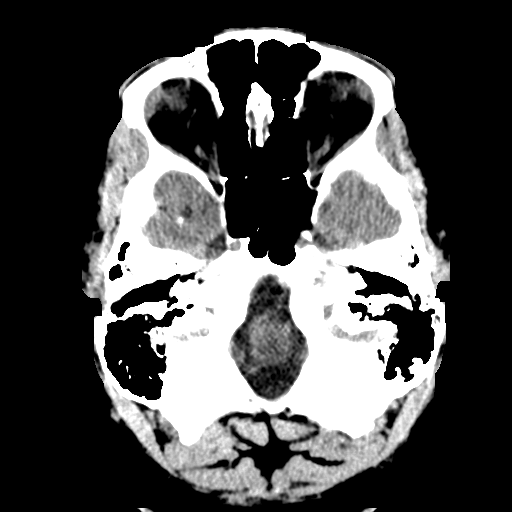
[im 3/31  bone]
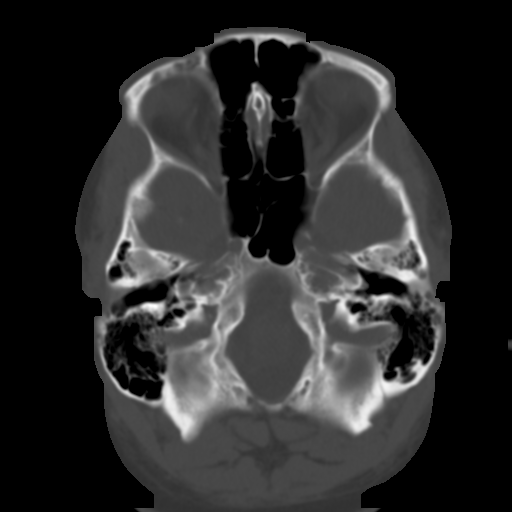
[im 6/31  brain]
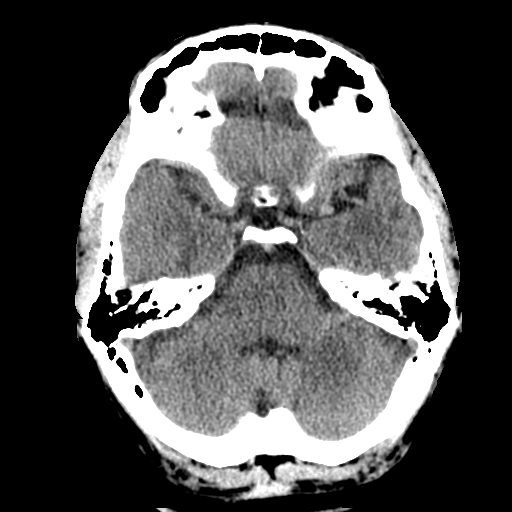
[im 9/31  brain]
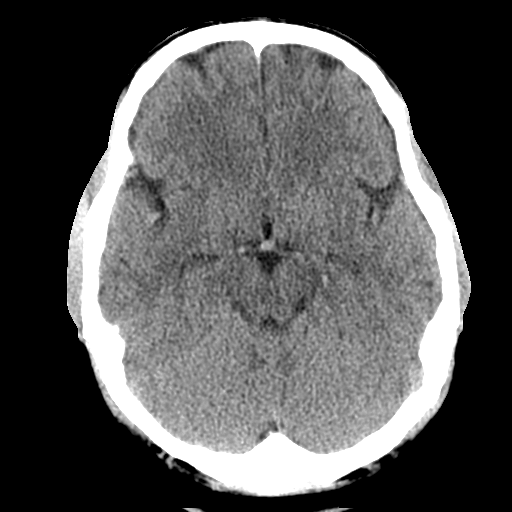
[im 12/31  brain]
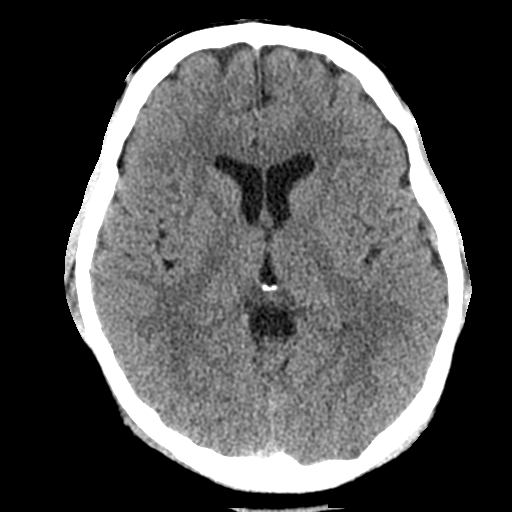
[im 16/31  brain]
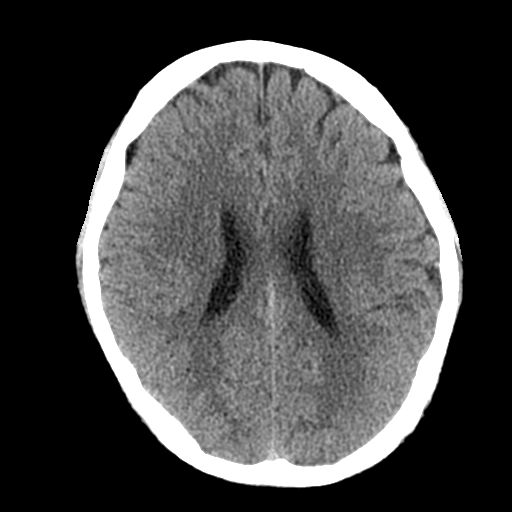
[im 16/31  bone]
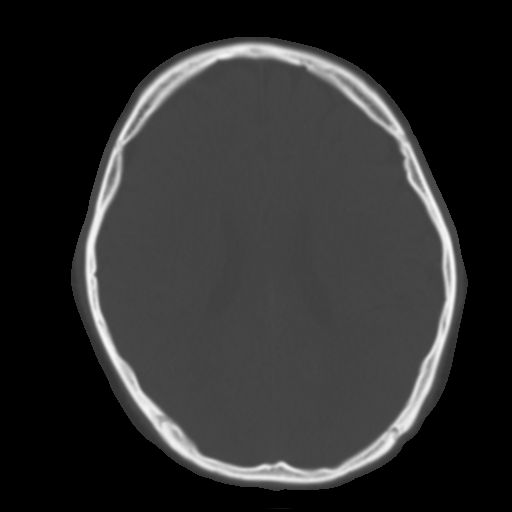
[im 19/31  brain]
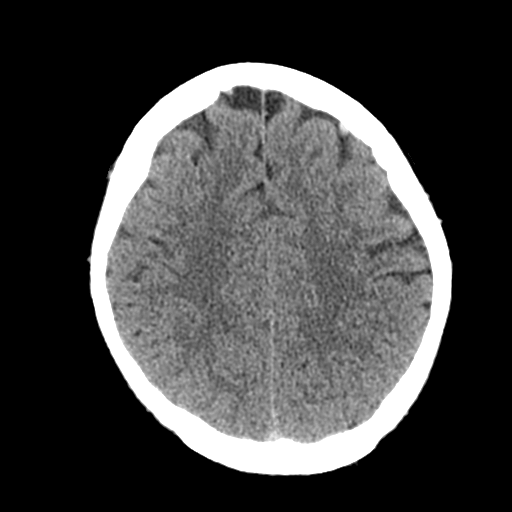
[im 22/31  brain]
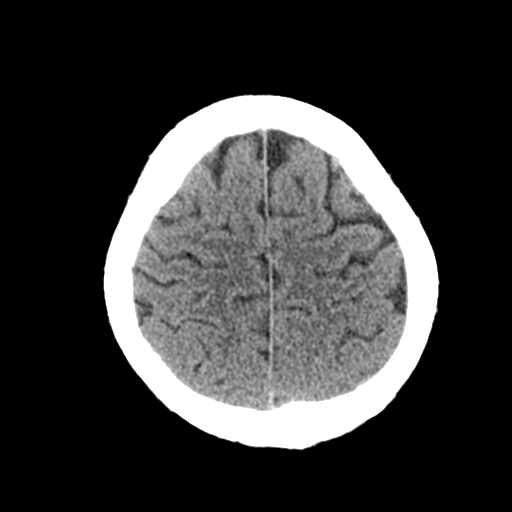
[im 25/31  brain]
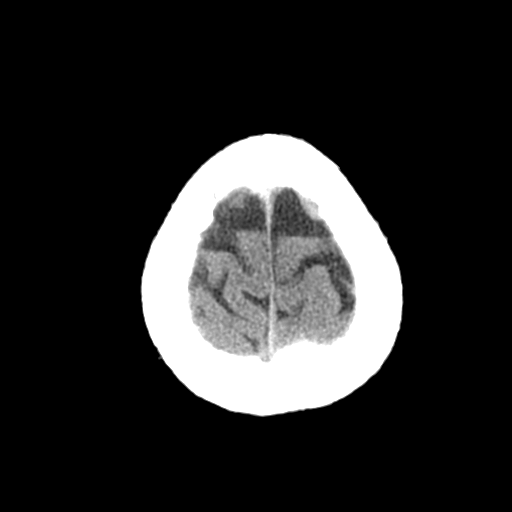
[im 28/31  brain]
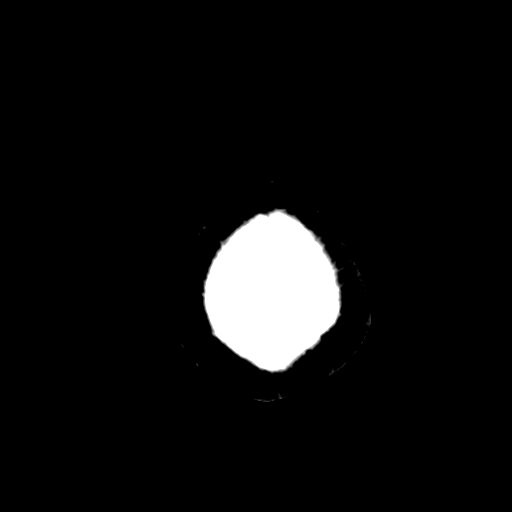
[im 28/31  bone]
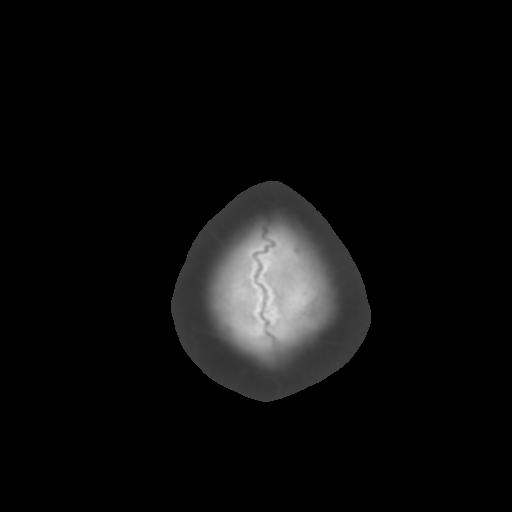

[Series 4: coronal soft tissue · coronal · 0.31mm/px · 3 of 67 slices shown]
[im 23/67  brain]
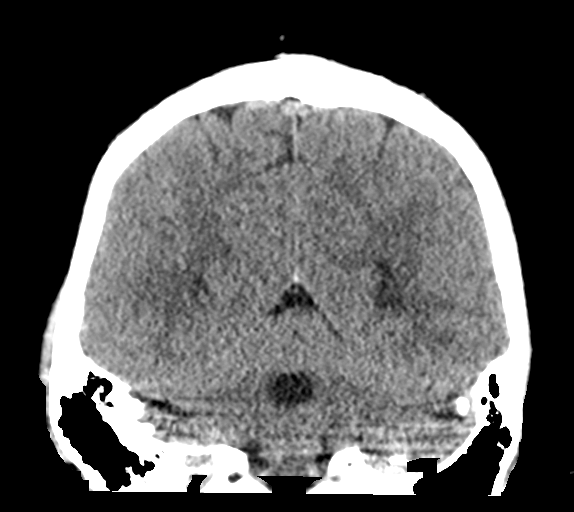
[im 30/67  brain]
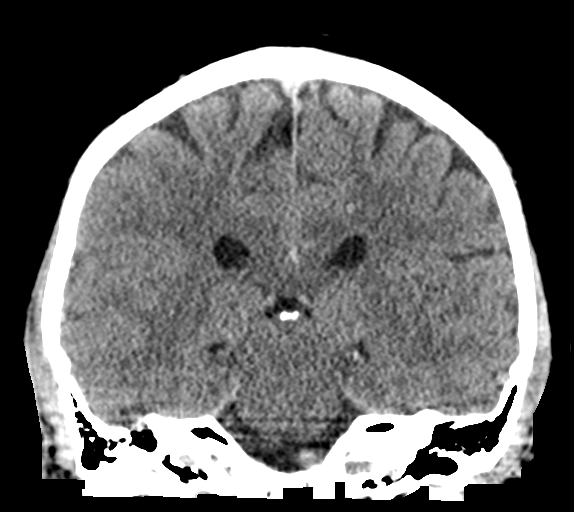
[im 37/67  brain]
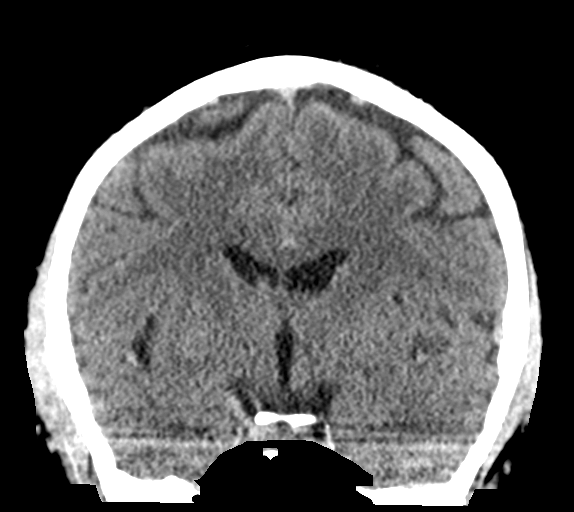

[Series 5: sagittal soft tissue · sagittal · 0.31mm/px · 3 of 60 slices shown]
[im 20/60  brain]
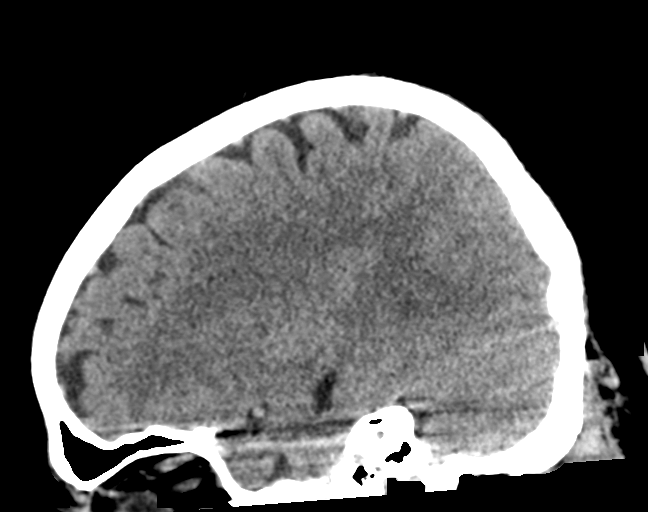
[im 30/60  brain]
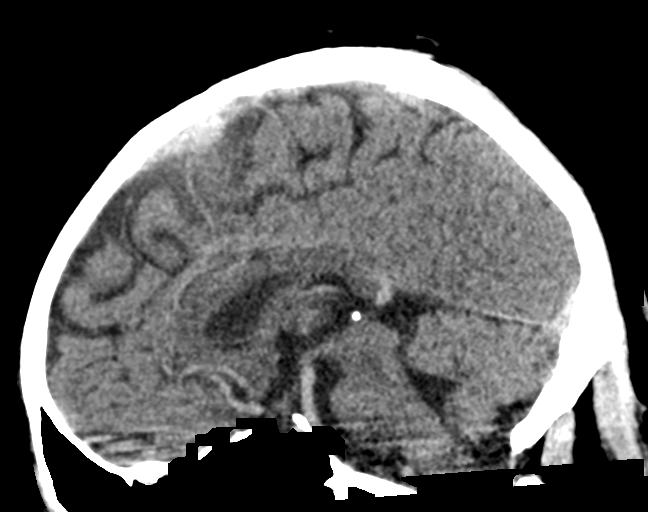
[im 40/60  brain]
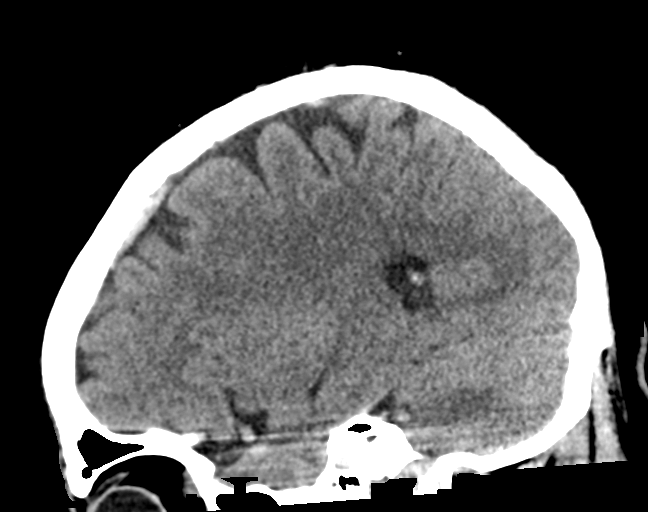

[15 of 47 positions shown; findings below may reference images not displayed]

FINDINGS: CT HEAD FINDINGS

Brain:

There is no acute intracranial hemorrhage.

No demarcated cortical infarct.

No extra-axial fluid collection.

No evidence of intracranial mass.

No midline shift.

Vascular: No hyperdense vessel.

Skull: Normal. Negative for fracture or focal lesion.

Sinuses/Orbits: Visualized orbits show no acute finding. No
significant paranasal sinus disease or mastoid effusion at the
imaged levels.

CT CERVICAL SPINE FINDINGS

Alignment: Nonspecific reversal of the expected cervical lordosis

Skull base and vertebrae: The basion-dental and atlanto-dental
intervals are maintained.No evidence of acute fracture to the
cervical spine.

Soft tissues and spinal canal: No prevertebral fluid or swelling. No
visible canal hematoma.

Disc levels: No significant bony spinal canal or neural foraminal
narrowing at any level.

Upper chest: The imaged lung apices are clear. No visible
pneumothorax.
IMPRESSION: CT head:

No evidence of acute intracranial abnormality.

CT cervical spine:

No evidence of acute fracture to the cervical spine.

## 2022-12-23 ENCOUNTER — Ambulatory Visit
Admission: EM | Admit: 2022-12-23 | Discharge: 2022-12-23 | Disposition: A | Discharge status: Home / Self Care | Payer: Medicaid Other

## 2022-12-23 ENCOUNTER — Encounter: Payer: Self-pay | Admitting: Emergency Medicine

## 2022-12-23 DIAGNOSIS — R197 Diarrhea, unspecified: Secondary | ICD-10-CM | POA: Diagnosis not present

## 2022-12-23 NOTE — ED Provider Notes (Signed)
[facility]    CSN: [id number] Arrival date & time: 12/23/22  0850      History   Chief Complaint Chief Complaint  Patient presents with   Abdominal Pain   Diarrhea   Shortness of Breath    HPI Fred Harris is a 27 y.o. male who presents with mother due to having abdominal pain and diarrhea since this am. Mother also noticed neck area fullness when he inhales and last night was expanding his chest a lot with deep breaths. He has not had a cough or fever. Mother states besides her, there has been another relative in the family that has had diarrhea in the past week. His mother got over it last week. Mother and pt ate the same foods last night. Pt has not been on antibiotics in the past month.     Past Medical History:  Diagnosis Date   Asthma     There are no problems to display for this patient.   History reviewed. No pertinent surgical history.   Home Medications    Prior to Admission medications   Medication Sig Start Date End Date Taking? Authorizing Provider  loratadine (CLARITIN) 10 MG tablet Take 10 mg by mouth daily. 11/30/22  Yes [provider]  ibuprofen (ADVIL,MOTRIN) 800 MG tablet Take 1 tablet (800 mg total) by mouth every 8 (eight) hours as needed for moderate pain. 12/15/14   Tommi Rumps, PA-C    Family History Family History  Problem Relation Age of Onset   Asthma Mother    Hypertension Maternal Grandmother    Hypertension Paternal Grandfather    Cancer Paternal Grandfather     Social History Social History   Tobacco Use   Smoking status: Never   Smokeless tobacco: Never  Vaping Use   Vaping Use: Never used  Substance Use Topics   Alcohol use: No   Drug use: No     Allergies   Patient has no known allergies.   Review of Systems Review of Systems As noted in HPI  Physical Exam Triage Vital Signs ED Triage Vitals  Enc Vitals Group     BP 12/23/22 0927 (!) 142/78     Pulse Rate 12/23/22 0927 85      Resp 12/23/22 0927 18     Temp 12/23/22 0927 99 F (37.2 C)     Temp Source 12/23/22 0927 Oral     SpO2 12/23/22 0927 98 %     Weight 12/23/22 0923 148 lb (67.1 kg)     Height --      Head Circumference --      Peak Flow --      Pain Score --      Pain Loc --      Pain Edu? --      Excl. in GC? --    No data found.  Updated Vital Signs BP (!) 142/78 (BP Location: Left Arm)   Pulse 85   Temp 99 F (37.2 C) (Oral)   Resp 18   Wt 148 lb (67.1 kg)   SpO2 98%   BMI 23.18 kg/m  BP repeated 138/76 Visual Acuity Right Eye Distance:   Left Eye Distance:   Bilateral Distance:    Right Eye Near:   Left Eye Near:    Bilateral Near:     Physical Exam Vitals and nursing note reviewed.  Constitutional:      General: He is not in acute distress.    Appearance: He  is not ill-appearing or toxic-appearing.  HENT:     Right Ear: External ear normal.     Left Ear: External ear normal.  Eyes:     General: No scleral icterus.    Conjunctiva/sclera: Conjunctivae normal.  Cardiovascular:     Rate and Rhythm: Normal rate and regular rhythm.     Heart sounds: No murmur heard. Pulmonary:     Effort: Pulmonary effort is normal.     Breath sounds: Normal breath sounds. No wheezing, rhonchi or rales.  Abdominal:     General: Abdomen is flat. There is no distension.     Palpations: Abdomen is soft. There is no mass.     Tenderness: There is no abdominal tenderness. There is no guarding or rebound.  Musculoskeletal:        General: Normal range of motion.     Cervical back: Neck supple.  Skin:    General: Skin is warm and dry.     Findings: No rash.  Neurological:     Mental Status: He is alert.     Gait: Gait normal.  Psychiatric:        Mood and Affect: Mood normal.      UC Treatments / Results  Labs (all labs ordered are listed, but only abnormal results are displayed) Labs Reviewed - No data to display  EKG   Radiology No results found.  Procedures Procedures  (including critical care time)  Medications Ordered in UC Medications - No data to display  Initial Impression / Assessment and Plan / UC Course  I have reviewed the triage vital signs and the nursing notes.  Viral diarrhea  Placed on BRAT diet and may take Pepto prn.  Final Clinical Impressions(s) / UC Diagnoses   Final diagnoses:  Diarrhea, unspecified type     Discharge Instructions      No daily until the the diarrhea resolves      ED Prescriptions   None    PDMP not reviewed this encounter.   Garey Ham, PA-C 12/23/22 1125

## 2022-12-23 NOTE — Discharge Instructions (Signed)
No daily until the the diarrhea resolves

## 2022-12-23 NOTE — ED Triage Notes (Signed)
Pt presents with abdominal pain and diarrhea that started this morning. Mom also states that the patient has been breathing funny since yesterday. She states when he inhales she noticed a muscle in his neck popped out.

## 2023-01-12 ENCOUNTER — Emergency Department
Admission: EM | Admit: 2023-01-12 | Discharge: 2023-01-12 | Disposition: A | Discharge status: Home / Self Care | Payer: Medicaid Other | Attending: Emergency Medicine | Admitting: Emergency Medicine

## 2023-01-12 ENCOUNTER — Other Ambulatory Visit: Payer: Self-pay

## 2023-01-12 ENCOUNTER — Emergency Department: Payer: Medicaid Other

## 2023-01-12 DIAGNOSIS — R0682 Tachypnea, not elsewhere classified: Secondary | ICD-10-CM | POA: Diagnosis present

## 2023-01-12 DIAGNOSIS — R0689 Other abnormalities of breathing: Secondary | ICD-10-CM

## 2023-01-12 LAB — BASIC METABOLIC PANEL
Anion gap: 10 (ref 5–15)
BUN: 16 mg/dL (ref 6–20)
CO2: 24 mmol/L (ref 22–32)
Calcium: 8.7 mg/dL — ABNORMAL LOW (ref 8.9–10.3)
Chloride: 104 mmol/L (ref 98–111)
Creatinine, Ser: 0.77 mg/dL (ref 0.61–1.24)
GFR, Estimated: >60 mL/min (ref >60)
Glucose, Bld: 97 mg/dL (ref 70–99)
Potassium: 3.7 mmol/L (ref 3.5–5.1)
Sodium: 138 mmol/L (ref 135–145)

## 2023-01-12 LAB — CBC
HCT: 47 % (ref 39.0–52.0)
Hemoglobin: 15.6 g/dL (ref 13.0–17.0)
MCH: 26 pg (ref 26.0–34.0)
MCHC: 33.2 g/dL (ref 30.0–36.0)
MCV: 78.5 fL — ABNORMAL LOW (ref 80.0–100.0)
Platelets: 258 10*3/uL (ref 150–400)
RBC: 5.99 MIL/uL — ABNORMAL HIGH (ref 4.22–5.81)
RDW: 13.3 % (ref 11.5–15.5)
WBC: 7.9 10*3/uL (ref 4.0–10.5)
nRBC: 0 % (ref 0.0–0.2)

## 2023-01-12 LAB — TROPONIN I (HIGH SENSITIVITY): Troponin I (High Sensitivity): 4 ng/L (ref <18)

## 2023-01-12 NOTE — Discharge Instructions (Signed)
Please seek medical attention for any high fevers, chest pain, shortness of breath, change in behavior, persistent vomiting, bloody stool or any other new or concerning symptoms.  

## 2023-01-12 NOTE — ED Provider Notes (Signed)
[facility] Provider Note    Event Date/Time   First MD Initiated Contact with Patient 01/12/23 1835     (approximate)   History   Heavy Breathing   HPI  Fred Harris is a 27 y.o. male  brought in by mother because of concern for an episode of heavy breathing. Apparently she noticed this today, although he has been having similar symptoms for a while. She says she took him to an urgent care a few weeks ago with similar concern. At the time of my exam the patient denies any shortness of breath. Denies any chest pain. No fevers.    Physical Exam   Triage Vital Signs: ED Triage Vitals [01/12/23 1715]  Enc Vitals Group     BP (!) 132/90     Pulse Rate (!) 125     Resp 16     Temp 98 F (36.7 C)     Temp Source Oral     SpO2 98 %     Weight      Height 5\' 7"  (1.702 m)     Head Circumference      Peak Flow      Pain Score 0     Pain Loc      Pain Edu?      Excl. in GC?     Most recent vital signs: Vitals:   01/12/23 1715  BP: (!) 132/90  Pulse: (!) 125  Resp: 16  Temp: 98 F (36.7 C)  SpO2: 98%   General: Awake, alert. CV:  Good peripheral perfusion. Regular rate and rhythm. Resp:  Normal effort. Lungs clear. Abd:  No distention.    ED Results / Procedures / Treatments   Labs (all labs ordered are listed, but only abnormal results are displayed) Labs Reviewed  BASIC METABOLIC PANEL - Abnormal; Notable for the following components:      Result Value   Calcium 8.7 (*)    All other components within normal limits  CBC - Abnormal; Notable for the following components:   RBC 5.99 (*)    MCV 78.5 (*)    All other components within normal limits  TROPONIN I (HIGH SENSITIVITY)  TROPONIN I (HIGH SENSITIVITY)     EKG  I, Phineas Semen, attending physician, personally viewed and interpreted this EKG  EKG Time: 1713 Rate: 116 Rhythm: sinus tachycardia Axis: normal Intervals: qtc 450 QRS: narrow ST changes: no st  elevation Impression: abnormal ekg    RADIOLOGY I independently interpreted and visualized the CXR. My interpretation: No pneumonia Radiology interpretation:  IMPRESSION:  No acute cardiopulmonary disease.      PROCEDURES:  Critical Care performed: No    MEDICATIONS ORDERED IN ED: Medications - No data to display   IMPRESSION / MDM / ASSESSMENT AND PLAN / ED COURSE  I reviewed the triage vital signs and the nursing notes.                              Differential diagnosis includes, but is not limited to, pneumonia, asthma, anemia, ACS  Patient's presentation is most consistent with acute presentation with potential threat to life or bodily function.   Patient brought in by mother because of concerns for an episode of heavy breathing.  At the time my exam patient is breathing normally.  He denies any complaints.  Mother states this has been going on for a while.  Chest x-ray here  without any pneumonia.  Blood work without concerning troponin elevation, anemia or electrolyte abnormality.  At this time unclear etiology of the observed heavy breathing however given reassuring workup with a is reasonable for patient be discharged home.     FINAL CLINICAL IMPRESSION(S) / ED DIAGNOSES   Final diagnoses:  Heavy breathing   Note:  This document was prepared using Dragon voice recognition software and may include unintentional dictation errors.    Phineas Semen, MD 01/12/23 [phone]

## 2023-01-12 NOTE — ED Triage Notes (Signed)
Pt to ed from home with his parents. Pt is IDD and denies any complaints at this time and unable to answer questions and is very repetitive. Mother advised pt was just seen on 5/20 at [facility] for same. Pt was prescribed Claritin. Pt is alert and oriented.

## 2023-04-20 ENCOUNTER — Other Ambulatory Visit: Payer: Self-pay

## 2023-04-20 ENCOUNTER — Encounter (HOSPITAL_COMMUNITY): Payer: Self-pay | Admitting: *Deleted

## 2023-04-20 ENCOUNTER — Emergency Department (HOSPITAL_COMMUNITY): Payer: MEDICAID

## 2023-04-20 ENCOUNTER — Emergency Department (HOSPITAL_COMMUNITY)
Admission: EM | Admit: 2023-04-20 | Discharge: 2023-04-20 | Disposition: A | Discharge status: Home / Self Care | Payer: MEDICAID | Attending: Emergency Medicine | Admitting: Emergency Medicine

## 2023-04-20 DIAGNOSIS — R456 Violent behavior: Secondary | ICD-10-CM | POA: Diagnosis present

## 2023-04-20 DIAGNOSIS — F840 Autistic disorder: Secondary | ICD-10-CM

## 2023-04-20 DIAGNOSIS — J45909 Unspecified asthma, uncomplicated: Secondary | ICD-10-CM | POA: Insufficient documentation

## 2023-04-20 DIAGNOSIS — Z20822 Contact with and (suspected) exposure to covid-19: Secondary | ICD-10-CM | POA: Diagnosis not present

## 2023-04-20 DIAGNOSIS — R000 Tachycardia, unspecified: Secondary | ICD-10-CM | POA: Insufficient documentation

## 2023-04-20 HISTORY — DX: Autistic disorder: F84.0

## 2023-04-20 LAB — CBC
HCT: 45.4 % (ref 39.0–52.0)
Hemoglobin: 15.6 g/dL (ref 13.0–17.0)
MCH: 26.8 pg (ref 26.0–34.0)
MCHC: 34.4 g/dL (ref 30.0–36.0)
MCV: 77.9 fL — ABNORMAL LOW (ref 80.0–100.0)
Platelets: 238 10*3/uL (ref 150–400)
RBC: 5.83 MIL/uL — ABNORMAL HIGH (ref 4.22–5.81)
RDW: 13 % (ref 11.5–15.5)
WBC: 5.1 10*3/uL (ref 4.0–10.5)
nRBC: 0 % (ref 0.0–0.2)

## 2023-04-20 LAB — COMPREHENSIVE METABOLIC PANEL
ALT: 18 U/L (ref 0–44)
AST: 19 U/L (ref 15–41)
Albumin: 3.9 g/dL (ref 3.5–5.0)
Alkaline Phosphatase: 144 U/L — ABNORMAL HIGH (ref 38–126)
Anion gap: 11 (ref 5–15)
BUN: 8 mg/dL (ref 6–20)
CO2: 25 mmol/L (ref 22–32)
Calcium: 8.7 mg/dL — ABNORMAL LOW (ref 8.9–10.3)
Chloride: 101 mmol/L (ref 98–111)
Creatinine, Ser: 0.72 mg/dL (ref 0.61–1.24)
GFR, Estimated: >60 mL/min (ref >60)
Glucose, Bld: 94 mg/dL (ref 70–99)
Potassium: 3.3 mmol/L — ABNORMAL LOW (ref 3.5–5.1)
Sodium: 137 mmol/L (ref 135–145)
Total Bilirubin: 1 mg/dL (ref 0.3–1.2)
Total Protein: 6.7 g/dL (ref 6.5–8.1)

## 2023-04-20 LAB — SARS CORONAVIRUS 2 BY RT PCR: SARS Coronavirus 2 by RT PCR: NEGATIVE

## 2023-04-20 MED ORDER — DIAZEPAM 2 MG PO TABS: 2.0000 mg | ORAL_TABLET | Freq: Three times a day (TID) | ORAL | 0 refills | Status: AC | PRN

## 2023-04-20 NOTE — ED Notes (Signed)
Security at bedside to wand patient.

## 2023-04-20 NOTE — ED Notes (Signed)
Pt aggressive towards mother and triage staff, telling everyone to "shut up" after redirection.

## 2023-04-20 NOTE — ED Notes (Signed)
EDP talking with pt's mother

## 2023-04-20 NOTE — ED Provider Notes (Signed)
  Provider Note MRN:  [id number]  Arrival date & time: 04/20/23    ED Course and Medical Decision Making  Assumed care from [place] at shift change.  See note from prior team for complete details, in brief:  27 yo male Autism  At church acting aggressive, "breathing heavy" per mother Now feeling better   Plan per prior physician f/u labs  Labs stable, chest x-ray negative, COVID test negative.  He is feeling much better.  Back to baseline per mother.  She is requesting recommendations for outpatient follow-up for primary care for anxiolytic medication to take as needed for the patient.  Will give short course of as needed Valium for home, follow-up with primary care with strongly encouraged.  The patient improved significantly and was discharged in stable condition. Detailed discussions were had with the patient regarding current findings, and need for close f/u with PCP or on call doctor. The patient has been instructed to return immediately if the symptoms worsen in any way for re-evaluation. Patient verbalized understanding and is in agreement with current care plan. All questions answered prior to discharge.    Procedures  Final Clinical Impressions(s) / ED Diagnoses     ICD-10-CM   1. Autism  F84.0       ED Discharge Orders          Ordered    diazepam (VALIUM) 2 MG tablet  Every 8 hours PRN        04/20/23 1723              Discharge Instructions      It was a pleasure caring for you today in the emergency department.  Please return to the emergency department for any worsening or worrisome symptoms.           Sloan Leiter, DO 04/20/23 1729

## 2023-04-20 NOTE — ED Notes (Signed)
EDP at bedside, plan is to discharge the pt. Gave the patient his belongings back.

## 2023-04-20 NOTE — ED Triage Notes (Signed)
Pt's mother with pt and states pt with autism, pt has been aggressive starting today.  Denies any medications.  Pt uncooperative during vitals. Mother states pt is usually cooperative and pleasant.  Mother states they recently moved here.

## 2023-04-20 NOTE — ED Notes (Addendum)
Lab at [facility]. Sitter present.

## 2023-04-20 NOTE — ED Notes (Signed)
Pt's mother returned to visit, states she doesn't want him to leave her. She is his mother and she chooses where he can or can't go. Mother seems confused about the psych process, I have tried to explain to her that he will speak to the MD and decisions will be made from that point on for plan of care. She doesn't want him to be transported anywhere. Will continue to update her as plan of care is decided.

## 2023-04-20 NOTE — ED Notes (Signed)
Instructed pt to provide urine sample but did not urinate in the cup, urinated in the toilet. Will attempt again.

## 2023-04-20 NOTE — ED Provider Notes (Signed)
[place] EMERGENCY DEPARTMENT AT [facility] Provider Note   CSN: [id number] Arrival date & time: 04/20/23  1349     History  Chief Complaint  Patient presents with   Aggressive Behavior    Fred Harris is a 27 y.o. male.  HPI Patient with history of autism.  Reportedly had been acting aggressive.  Had been acting out at church.  Reportedly doing well yesterday and doing better now.  Reportedly had been potentially breathing hard.  History comes from patient's mother but still somewhat difficult to get history.   Past Medical History:  Diagnosis Date   Asthma    Autism     Home Medications Prior to Admission medications   Medication Sig Start Date End Date Taking? Authorizing Provider  ibuprofen (ADVIL,MOTRIN) 800 MG tablet Take 1 tablet (800 mg total) by mouth every 8 (eight) hours as needed for moderate pain. 12/15/14   Tommi Rumps, PA-C  loratadine (CLARITIN) 10 MG tablet Take 10 mg by mouth daily. 11/30/22   [provider]      Allergies    Patient has no known allergies.    Review of Systems   Review of Systems  Physical Exam Updated Vital Signs BP (!) 134/99 (BP Location: Left Arm)   Pulse (!) 105   Temp 99.8 F (37.7 C) (Oral)   Resp 20   Ht 5\' 7"  (1.702 m)   SpO2 100%   BMI 23.18 kg/m  Physical Exam Vitals and nursing note reviewed.  HENT:     Head: Normocephalic.  Cardiovascular:     Rate and Rhythm: Tachycardia present.  Chest:     Chest wall: No tenderness.  Abdominal:     Tenderness: There is no abdominal tenderness. There is no guarding.  Skin:    Capillary Refill: Capillary refill takes less than 2 seconds.  Neurological:     Mental Status: He is alert. Mental status is at baseline.     ED Results / Procedures / Treatments   Labs (all labs ordered are listed, but only abnormal results are displayed) Labs Reviewed  COMPREHENSIVE METABOLIC PANEL - Abnormal; Notable for the following components:       Result Value   Potassium 3.3 (*)    Calcium 8.7 (*)    Alkaline Phosphatase 144 (*)    All other components within normal limits  CBC - Abnormal; Notable for the following components:   RBC 5.83 (*)    MCV 77.9 (*)    All other components within normal limits  SARS CORONAVIRUS 2 BY RT PCR  RAPID URINE DRUG SCREEN, HOSP PERFORMED  URINALYSIS, W/ REFLEX TO CULTURE (INFECTION SUSPECTED)    EKG None  Radiology DG Chest Portable 1 View  Result Date: 04/20/2023 CLINICAL DATA:  Shortness of breath EXAM: PORTABLE CHEST 1 VIEW COMPARISON:  01/12/2023 FINDINGS: The heart size and mediastinal contours are within normal limits. Both lungs are clear. The visualized skeletal structures are unremarkable. IMPRESSION: No active disease. Electronically Signed   By: Gaylyn Rong M.D.   On: 04/20/2023 14:52    Procedures Procedures    Medications Ordered in ED Medications - No data to display  ED Course/ Medical Decision Making/ A&P                                 Medical Decision Making Amount and/or Complexity of Data Reviewed Labs: ordered. Radiology: ordered.   Patient  with reported mental status change.  Does have baseline autism.  Somewhat difficult to get history.  Blood work done and overall reassuring.  Difficult to get temperature but was 100.2.  Will get urinalysis.  Chest x-ray reassuring.  Benign abdominal and pulmonary exam.  Likely should be able to discharge home but care turned over to Dr. Wallace Cullens        Final Clinical Impression(s) / ED Diagnoses Final diagnoses:  Autism    Rx / DC Orders ED Discharge Orders     None         Benjiman Core, MD 04/20/23 1510

## 2023-04-20 NOTE — ED Notes (Signed)
Mother asking to speak to RN regarding evaluation and medication questions/concerns. RN told Mother the EDP would come speak with her about the plan of care.

## 2023-04-20 NOTE — Discharge Instructions (Addendum)
It was a pleasure caring for you today in the emergency department. ° °Please return to the emergency department for any worsening or worrisome symptoms. ° ° °

## 2023-04-20 NOTE — ED Notes (Signed)
EDP has not been by to speak to Mother, messaged EDP if he could place a diet order for dinner.

## 2023-04-27 ENCOUNTER — Other Ambulatory Visit: Payer: Self-pay

## 2023-04-27 ENCOUNTER — Encounter: Payer: Self-pay | Admitting: Emergency Medicine

## 2023-04-27 ENCOUNTER — Emergency Department
Admission: EM | Admit: 2023-04-27 | Discharge: 2023-04-29 | Disposition: A | Discharge status: Home / Self Care | Payer: MEDICAID | Attending: Emergency Medicine | Admitting: Emergency Medicine

## 2023-04-27 DIAGNOSIS — R456 Violent behavior: Secondary | ICD-10-CM | POA: Insufficient documentation

## 2023-04-27 DIAGNOSIS — F919 Conduct disorder, unspecified: Secondary | ICD-10-CM | POA: Diagnosis not present

## 2023-04-27 DIAGNOSIS — F840 Autistic disorder: Secondary | ICD-10-CM | POA: Insufficient documentation

## 2023-04-27 DIAGNOSIS — R4689 Other symptoms and signs involving appearance and behavior: Secondary | ICD-10-CM

## 2023-04-27 HISTORY — DX: Anxiety disorder, unspecified: F41.9

## 2023-04-27 LAB — CBC WITH DIFFERENTIAL/PLATELET
Abs Immature Granulocytes: 0.01 10*3/uL (ref 0.00–0.07)
Basophils Absolute: 0 10*3/uL (ref 0.0–0.1)
Basophils Relative: 0 %
Eosinophils Absolute: 0 10*3/uL (ref 0.0–0.5)
Eosinophils Relative: 0 %
HCT: 46.6 % (ref 39.0–52.0)
Hemoglobin: 15.7 g/dL (ref 13.0–17.0)
Immature Granulocytes: 0 %
Lymphocytes Relative: 42 %
Lymphs Abs: 1.9 10*3/uL (ref 0.7–4.0)
MCH: 26.2 pg (ref 26.0–34.0)
MCHC: 33.7 g/dL (ref 30.0–36.0)
MCV: 77.8 fL — ABNORMAL LOW (ref 80.0–100.0)
Monocytes Absolute: 0.5 10*3/uL (ref 0.1–1.0)
Monocytes Relative: 11 %
Neutro Abs: 2.2 10*3/uL (ref 1.7–7.7)
Neutrophils Relative %: 47 %
Platelets: 274 10*3/uL (ref 150–400)
RBC: 5.99 MIL/uL — ABNORMAL HIGH (ref 4.22–5.81)
RDW: 12.8 % (ref 11.5–15.5)
WBC: 4.6 10*3/uL (ref 4.0–10.5)
nRBC: 0 % (ref 0.0–0.2)

## 2023-04-27 LAB — LIPASE, BLOOD: Lipase: 19 U/L (ref 11–51)

## 2023-04-27 LAB — COMPREHENSIVE METABOLIC PANEL
ALT: 21 U/L (ref 0–44)
AST: 24 U/L (ref 15–41)
Albumin: 4.2 g/dL (ref 3.5–5.0)
Alkaline Phosphatase: 145 U/L — ABNORMAL HIGH (ref 38–126)
Anion gap: 9 (ref 5–15)
BUN: 10 mg/dL (ref 6–20)
CO2: 24 mmol/L (ref 22–32)
Calcium: 8.6 mg/dL — ABNORMAL LOW (ref 8.9–10.3)
Chloride: 103 mmol/L (ref 98–111)
Creatinine, Ser: 0.66 mg/dL (ref 0.61–1.24)
GFR, Estimated: >60 mL/min (ref >60)
Glucose, Bld: 108 mg/dL — ABNORMAL HIGH (ref 70–99)
Potassium: 3.6 mmol/L (ref 3.5–5.1)
Sodium: 136 mmol/L (ref 135–145)
Total Bilirubin: 1.1 mg/dL (ref 0.3–1.2)
Total Protein: 6.9 g/dL (ref 6.5–8.1)

## 2023-04-27 LAB — ETHANOL: Alcohol, Ethyl (B): <10 mg/dL (ref <10)

## 2023-04-27 MED ORDER — GABAPENTIN 100 MG PO CAPS: 200.0000 mg | ORAL_CAPSULE | Freq: Three times a day (TID) | ORAL | Status: DC | PRN

## 2023-04-27 NOTE — ED Notes (Addendum)
Pt dressed out in scrubs:  1 Shirt 1 pair Pants 1 pair Shoes 1 pair Socks 1 pair underware  No other valuables  Everything placed in bag and labeled  - will be placed in BHU lockup

## 2023-04-27 NOTE — ED Notes (Signed)
With mom in room I was able to draw labs. Pt continued to protest verbally but cooperated in every way. At this time, pt is refusing to dress out. Will attempt after pt settles down.

## 2023-04-27 NOTE — ED Triage Notes (Signed)
Arrives with mother and sister. C?O worsening aggitation x 1 yesterday.  Started swinging at people.  Patietn arrives agitated.  Yelling. Not answering questions appropriately.  Refusing blood draw and vital signs in Triage.  Security in room.

## 2023-04-27 NOTE — ED Notes (Signed)
This NT provided pt with pm snack. 

## 2023-04-27 NOTE — Consult Note (Signed)
[facility] Face-to-Face Psychiatry Consult   Reason for Consult:  aggression Referring Physician:  EDP Patient Identification: Fred Harris MRN:  [id number] Principal Diagnosis: Behavior disturbance Diagnosis:  Principal Problem:   Behavior disturbance Active Problems:   Autism   Total Time spent with patient: 45 minutes  Subjective:   Fred Harris is a 27 y.o. male patient admitted with aggressive behaviors.  HPI:  27 yo male presented to the ED for an increase in aggression that started yesterday.  He was agitated last week and his mother took him to [facility] ED as this is not normal behavior for him.  She did not want to leave him and left before a psych evaluation was completed.  He was given Valium 2 mg, however, which seems to have increased his symptoms as yesterday he started moving his arm as if to hit someone but did not along with cursing.  Fred Harris goes to an adult day care and he is "always a good boy and has been at home until last week."  His mother provided most of the information as Fred Harris was pacing and agitated.  The mother reports he does have stomach issues and unsure if this is what is happening, unsure when his last BM was.  Eating without issues.  The mother does report he had a fever recently.  Sleep has not been well.  No previous psychiatric evaluations or medications.  Once he is medically cleared, medications will be recommended if warranted.  Past Psychiatric History: Autism  Risk to Self:  none Risk to Others:  none Prior Inpatient Therapy:  none Prior Outpatient Therapy:  none  Past Medical History:  Past Medical History:  Diagnosis Date   Anxiety    Asthma    Autism    No past surgical history on file. Family History:  Family History  Problem Relation Age of Onset   Asthma Mother    Hypertension Maternal Grandmother    Hypertension Paternal Grandfather    Cancer Paternal Grandfather    Family Psychiatric  History: none Social History:   Social History   Substance and Sexual Activity  Alcohol Use No     Social History   Substance and Sexual Activity  Drug Use No    Social History   Socioeconomic History   Marital status: Single    Spouse name: Not on file   Number of children: Not on file   Years of education: Not on file   Highest education level: Not on file  Occupational History   Not on file  Tobacco Use   Smoking status: Never   Smokeless tobacco: Never  Vaping Use   Vaping status: Never Used  Substance and Sexual Activity   Alcohol use: No   Drug use: No   Sexual activity: Not on file  Other Topics Concern   Not on file  Social History Narrative   Not on file   Social Determinants of Health   Financial Resource Strain: Not on file  Food Insecurity: Not on file  Transportation Needs: Not on file  Physical Activity: Not on file  Stress: Not on file  Social Connections: Not on file   Additional Social History:    Allergies:  No Known Allergies  Labs:  Results for orders placed or performed during the hospital encounter of 04/27/23 (from the past 48 hour(s))  CBC with Diff     Status: Abnormal   Collection Time: 04/27/23 12:59 PM  Result Value Ref Range  WBC 4.6 4.0 - 10.5 K/uL   RBC 5.99 (H) 4.22 - 5.81 MIL/uL   Hemoglobin 15.7 13.0 - 17.0 g/dL   HCT 86.5 78.4 - 69.6 %   MCV 77.8 (L) 80.0 - 100.0 fL   MCH 26.2 26.0 - 34.0 pg   MCHC 33.7 30.0 - 36.0 g/dL   RDW 29.5 28.4 - 13.2 %   Platelets 274 150 - 400 K/uL   nRBC 0.0 0.0 - 0.2 %   Neutrophils Relative % 47 %   Neutro Abs 2.2 1.7 - 7.7 K/uL   Lymphocytes Relative 42 %   Lymphs Abs 1.9 0.7 - 4.0 K/uL   Monocytes Relative 11 %   Monocytes Absolute 0.5 0.1 - 1.0 K/uL   Eosinophils Relative 0 %   Eosinophils Absolute 0.0 0.0 - 0.5 K/uL   Basophils Relative 0 %   Basophils Absolute 0.0 0.0 - 0.1 K/uL   Immature Granulocytes 0 %   Abs Immature Granulocytes 0.01 0.00 - 0.07 K/uL    Comment: Performed at [facility],  [address] [place], [place] [address]    No current facility-administered medications for this encounter.   Current Outpatient Medications  Medication Sig Dispense Refill   diazepam (VALIUM) 2 MG tablet Take 1 tablet (2 mg total) by mouth every 8 (eight) hours as needed for anxiety. 10 tablet 0   ibuprofen (ADVIL,MOTRIN) 800 MG tablet Take 1 tablet (800 mg total) by mouth every 8 (eight) hours as needed for moderate pain. 30 tablet 0   loratadine (CLARITIN) 10 MG tablet Take 10 mg by mouth daily.      Musculoskeletal: Strength & Muscle Tone: within normal limits Gait & Station: normal Patient leans: N/A  Psychiatric Specialty Exam: Physical Exam Vitals and nursing note reviewed.  Constitutional:      Appearance: Normal appearance.  HENT:     Head: Normocephalic.     Nose: Nose normal.  Pulmonary:     Effort: Pulmonary effort is normal.  Musculoskeletal:        General: Normal range of motion.     Cervical back: Normal range of motion.  Neurological:     General: No focal deficit present.     Mental Status: He is alert and oriented to person, place, and time.     Review of Systems  Psychiatric/Behavioral:  The patient is nervous/anxious.   All other systems reviewed and are negative.   Height 5\' 7"  (1.702 m), weight 67.1 kg.Body mass index is 23.17 kg/m.  General Appearance: Casual  Eye Contact:  Fair  Speech:  Normal Rate  Volume:  Increased  Mood:  Anxious and Irritable  Affect:  Blunt  Thought Process:  Coherent  Orientation:  Full (Time, Place, and Person)  Thought Content:  Logical  Suicidal Thoughts:  No  Homicidal Thoughts:  No  Memory:  Immediate;   Poor Recent;   Poor Remote;   Poor  Judgement:  Impaired  Insight:  Lacking  Psychomotor Activity:  Increased  Concentration:  Concentration: Poor and Attention Span: Poor  Recall:  Poor  Fund of Knowledge:  Poor  Language:  Fair  Akathisia:  No  Handed:  Right  AIMS (if indicated):     Assets:   Housing Leisure Time Physical Health Resilience Social Support  ADL's:  Intact  Cognition:  Impaired,  Moderate  Sleep:        Physical Exam: Physical Exam Vitals and nursing note reviewed.  Constitutional:  Appearance: Normal appearance.  HENT:     Head: Normocephalic.     Nose: Nose normal.  Pulmonary:     Effort: Pulmonary effort is normal.  Musculoskeletal:        General: Normal range of motion.     Cervical back: Normal range of motion.  Neurological:     General: No focal deficit present.     Mental Status: He is alert and oriented to person, place, and time.    Review of Systems  Psychiatric/Behavioral:  The patient is nervous/anxious.   All other systems reviewed and are negative.  Height 5\' 7"  (1.702 m), weight 67.1 kg. Body mass index is 23.17 kg/m.  Treatment Plan Summary: Daily contact with patient to assess and evaluate symptoms and progress in treatment, Medication management, and Plan : Autism with behavioral changes: Awaiting medical clearance  Disposition: Supportive therapy provided about ongoing stressors.  Nanine Means, NP 04/27/2023 1:23 PM

## 2023-04-27 NOTE — ED Provider Notes (Signed)
[facility] Provider Note    Event Date/Time   First MD Initiated Contact with Patient 04/27/23 1237     (approximate)   History   Mental Health Problem   HPI  Fred Harris is a 27 year old male with history of autism presenting to the emergency department for evaluation of increased aggression.  Patient with limited ability to provide history, no family at bedside, did review history obtained from Psych NP.  Mom reportedly stated that patient had increased aggression today has a history of "stomach issues", unsure last bowel movement has been eating normally.  Patient tells me that he does not think he has been having abdominal pain.  Denies other complaints.  Denies SI, HI, AVH.  Patient was seen in [facility] ER on 9/15 for aggressive behavior and heavy breathing.  Workup was reassuring.  Discharged with a short course of Valium.       Physical Exam   Triage Vital Signs: ED Triage Vitals  Encounter Vitals Group     BP --      Systolic BP Percentile --      Diastolic BP Percentile --      Pulse --      Resp --      Temp --      Temp src --      SpO2 --      Weight 04/27/23 1140 147 lb 14.9 oz (67.1 kg)     Height 04/27/23 1140 5\' 7"  (1.702 m)     Head Circumference --      Peak Flow --      Pain Score 04/27/23 1139 0     Pain Loc --      Pain Education --      Exclude from Growth Chart --     Most recent vital signs: Vitals:   04/27/23 1416  BP: (!) 142/94  Pulse: (!) 110  Resp: 20  Temp: 98.4 F (36.9 C)  SpO2: 97%     General: Awake, interactive  CV:  Regular rate Resp:  Unlabored respirations Abd:  Soft, nondistended, no appreciable tenderness to palpation  Neuro:  Symmetric facial movement, fluid speech   ED Results / Procedures / Treatments   Labs (all labs ordered are listed, but only abnormal results are displayed) Labs Reviewed  COMPREHENSIVE METABOLIC PANEL - Abnormal; Notable for the following components:       Result Value   Glucose, Bld 108 (*)    Calcium 8.6 (*)    Alkaline Phosphatase 145 (*)    All other components within normal limits  CBC WITH DIFFERENTIAL/PLATELET - Abnormal; Notable for the following components:   RBC 5.99 (*)    MCV 77.8 (*)    All other components within normal limits  ETHANOL  LIPASE, BLOOD  URINE DRUG SCREEN, QUALITATIVE ([facility] ONLY)  URINALYSIS, W/ REFLEX TO CULTURE (INFECTION SUSPECTED)     EKG EKG independently reviewed interpreted by myself (ER attending) demonstrates:    RADIOLOGY Imaging independently reviewed and interpreted by myself demonstrates:    PROCEDURES:  Critical Care performed: No  Procedures   MEDICATIONS ORDERED IN ED: Medications  gabapentin (NEURONTIN) capsule 200 mg (has no administration in time range)     IMPRESSION / MDM / ASSESSMENT AND PLAN / ED COURSE  I reviewed the triage vital signs and the nursing notes.  Differential diagnosis includes, but is not limited to, increased aggression in the setting of medical precipitant, decompensated psychiatric disorder, substance-induced  mood disorder  Patient's presentation is most consistent with acute presentation with potential threat to life or bodily function.  27 year old male presenting to the emergency department for evaluation of increased aggressive behavior.  Laying calmly in bed at the time of my evaluation though he provides limited history.  I did discuss the case with NP Lord with behavioral health who evaluated the patient.  She did note that family had reported possible abdominal pain.  Abdominal exam for me is overall reassuring.  Will obtain lab work and urinalysis for further medical evaluation. Psychiatry and TTS consulted.  The patient has been placed in psychiatric observation due to the need to provide a safe environment for the patient while obtaining psychiatric consultation and evaluation, as well as ongoing medical and medication management to  treat the patient's condition.  The patient has not been placed under full IVC at this time.   Left significant derangement.  Urinalysis pending.  If this is normal, suspect patient will be appropriate to be medically cleared.     FINAL CLINICAL IMPRESSION(S) / ED DIAGNOSES   Final diagnoses:  Aggressive behavior     Rx / DC Orders   ED Discharge Orders     None        Note:  This document was prepared using Dragon voice recognition software and may include unintentional dictation errors.   Trinna Post, MD 04/27/23 [phone]

## 2023-04-28 LAB — URINALYSIS, W/ REFLEX TO CULTURE (INFECTION SUSPECTED)
Bilirubin Urine: NEGATIVE
Glucose, UA: NEGATIVE mg/dL
Hgb urine dipstick: NEGATIVE
Ketones, ur: 20 mg/dL — AB
Leukocytes,Ua: NEGATIVE
Nitrite: NEGATIVE
Protein, ur: NEGATIVE mg/dL
Specific Gravity, Urine: 1.021 (ref 1.005–1.030)
WBC, UA: 0 WBC/hpf (ref 0–5)
pH: 6 (ref 5.0–8.0)

## 2023-04-28 LAB — URINE DRUG SCREEN, QUALITATIVE (ARMC ONLY)
Amphetamines, Ur Screen: NOT DETECTED
Barbiturates, Ur Screen: NOT DETECTED
Benzodiazepine, Ur Scrn: POSITIVE — AB
Cannabinoid 50 Ng, Ur ~~LOC~~: NOT DETECTED
Cocaine Metabolite,Ur ~~LOC~~: NOT DETECTED
MDMA (Ecstasy)Ur Screen: NOT DETECTED
Methadone Scn, Ur: NOT DETECTED
Opiate, Ur Screen: NOT DETECTED
Phencyclidine (PCP) Ur S: NOT DETECTED
Tricyclic, Ur Screen: NOT DETECTED

## 2023-04-28 NOTE — ED Notes (Signed)
Pt moved into room 24. Pt given warm blanket and pillow. Denies other needs.

## 2023-04-28 NOTE — ED Notes (Signed)
Breakfast provided.

## 2023-04-28 NOTE — ED Provider Notes (Signed)
Emergency Medicine Observation Re-evaluation Note  Fred Harris is a 27 y.o. male, seen on rounds today.  Pt initially presented to the ED for complaints of Mental Health Problem Currently, the patient is resting, voices no medical complaints.  Physical Exam  BP (!) 155/91 (BP Location: Left Arm)   Pulse 100   Temp 98.6 F (37 C) (Oral)   Resp 20   Ht 5\' 7"  (1.702 m)   Wt 67.1 kg   SpO2 98%   BMI 23.17 kg/m  Physical Exam General: Resting in no acute distress Cardiac: No cyanosis Lungs: Equal rise and fall Psych: Not agitated  ED Course / MDM  EKG:   I have reviewed the labs performed to date as well as medications administered while in observation.  Recent changes in the last 24 hours include no events overnight.  Plan  Current plan is for psychiatric disposition.    Irean Hong, MD 04/28/23 [phone]

## 2023-04-28 NOTE — ED Notes (Signed)
RN Amy, EDT Joyice Faster and this EDT Faith clean pt up. Pt was soiled including bed sheets,blankets an scrubs. Pt given wipes and cleaning spray to wipe up in bathroom. Pt given new sheets and clothing. Unknown is pt is incontinent.Pt understood to clean up self. No other needs at this time.

## 2023-04-28 NOTE — ED Notes (Signed)
Report received from [place] , English as a second language teacher. On initial round after report Pt is warm/dry, resting quietly in room without any s/s of distress.  Will continue to monitor throughout shift as ordered for any changes in behaviors and for continued safety.

## 2023-04-28 NOTE — ED Notes (Signed)
VOL/pending dispo

## 2023-04-28 NOTE — ED Notes (Signed)
Hospital meal provided.  100% consumed, pt tolerated w/o complaints.  Waste discarded appropriately.   

## 2023-04-29 DIAGNOSIS — F919 Conduct disorder, unspecified: Secondary | ICD-10-CM

## 2023-04-29 MED ORDER — GABAPENTIN 100 MG PO CAPS: 200.0000 mg | ORAL_CAPSULE | Freq: Three times a day (TID) | ORAL | 0 refills | Status: AC | PRN

## 2023-04-29 NOTE — Consult Note (Signed)
[facility] Face-to-Face Psychiatry Consult   Reason for Consult:  aggression Referring Physician:  EDP Patient Identification: Fred Harris MRN:  [id number] Principal Diagnosis: Behavior disturbance Diagnosis:  Principal Problem:   Behavior disturbance Active Problems:   Autism   Total Time spent with patient: 45 minutes  Subjective:   Fred Harris is a 27 y.o. male patient admitted with aggressive behaviors.  The client was started on gabapentin for anxiety/agitation and Valium that was started discontinued as it appears to have increased his agitation.  He has remained calm and cooperative in the ED with no behavior issues.  Polite and smiling on assessment.  Denies any issues, no suicidal/homicidal ideations, hallucinations, or substance use.  His mother, guardian, contacted for pharmacy information and medication education.  Encouraged her to follow up with his outpatient provider.  No concerns on her end and is present to pick him up to return home.  HPI on admission:  27 yo male presented to the ED for an increase in aggression that started yesterday.  He was agitated last week and his mother took him to [facility] ED as this is not normal behavior for him.  She did not want to leave him and left before a psych evaluation was completed.  He was given Valium 2 mg, however, which seems to have increased his symptoms as yesterday he started moving his arm as if to hit someone but did not along with cursing.  Guadalupe goes to an adult day care and he is "always a good boy and has been at home until last week."  His mother provided most of the information as Fred Harris was pacing and agitated.  The mother reports he does have stomach issues and unsure if this is what is happening, unsure when his last BM was.  Eating without issues.  The mother does report he had a fever recently.  Sleep has not been well.  No previous psychiatric evaluations or medications.  Once he is medically cleared,  medications will be recommended if warranted.  Past Psychiatric History: Autism  Risk to Self:  none Risk to Others:  none Prior Inpatient Therapy:  none Prior Outpatient Therapy:  none  Past Medical History:  Past Medical History:  Diagnosis Date   Anxiety    Asthma    Autism    No past surgical history on file. Family History:  Family History  Problem Relation Age of Onset   Asthma Mother    Hypertension Maternal Grandmother    Hypertension Paternal Grandfather    Cancer Paternal Grandfather    Family Psychiatric  History: none Social History:  Social History   Substance and Sexual Activity  Alcohol Use No     Social History   Substance and Sexual Activity  Drug Use No    Social History   Socioeconomic History   Marital status: Single    Spouse name: Not on file   Number of children: Not on file   Years of education: Not on file   Highest education level: Not on file  Occupational History   Not on file  Tobacco Use   Smoking status: Never   Smokeless tobacco: Never  Vaping Use   Vaping status: Never Used  Substance and Sexual Activity   Alcohol use: No   Drug use: No   Sexual activity: Not on file  Other Topics Concern   Not on file  Social History Narrative   Not on file   Social Determinants of Health  Financial Resource Strain: Not on file  Food Insecurity: Not on file  Transportation Needs: Not on file  Physical Activity: Not on file  Stress: Not on file  Social Connections: Not on file   Additional Social History:    Allergies:  No Known Allergies  Labs:  Results for orders placed or performed during the hospital encounter of 04/27/23 (from the past 48 hour(s))  Comprehensive metabolic panel     Status: Abnormal   Collection Time: 04/27/23 12:59 PM  Result Value Ref Range   Sodium 136 135 - 145 mmol/L   Potassium 3.6 3.5 - 5.1 mmol/L   Chloride 103 98 - 111 mmol/L   CO2 24 22 - 32 mmol/L   Glucose, Bld 108 (H) 70 - 99 mg/dL     Comment: Glucose reference range applies only to samples taken after fasting for at least 8 hours.   BUN 10 6 - 20 mg/dL   Creatinine, Ser 2.44 0.61 - 1.24 mg/dL   Calcium 8.6 (L) 8.9 - 10.3 mg/dL   Total Protein 6.9 6.5 - 8.1 g/dL   Albumin 4.2 3.5 - 5.0 g/dL   AST 24 15 - 41 U/L   ALT 21 0 - 44 U/L   Alkaline Phosphatase 145 (H) 38 - 126 U/L   Total Bilirubin 1.1 0.3 - 1.2 mg/dL   GFR, Estimated >01 >02 mL/min    Comment: (NOTE) Calculated using the CKD-EPI Creatinine Equation (2021)    Anion gap 9 5 - 15    Comment: Performed at [facility], [address] [place], [place] [address]  Ethanol     Status: None   Collection Time: 04/27/23 12:59 PM  Result Value Ref Range   Alcohol, Ethyl (B) <10 <10 mg/dL    Comment: (NOTE) Lowest detectable limit for serum alcohol is 10 mg/dL.  For medical purposes only. Performed at [facility], [address] [place], [place] [address]   CBC with Diff     Status: Abnormal   Collection Time: 04/27/23 12:59 PM  Result Value Ref Range   WBC 4.6 4.0 - 10.5 K/uL   RBC 5.99 (H) 4.22 - 5.81 MIL/uL   Hemoglobin 15.7 13.0 - 17.0 g/dL   HCT 47.4 25.9 - 56.3 %   MCV 77.8 (L) 80.0 - 100.0 fL   MCH 26.2 26.0 - 34.0 pg   MCHC 33.7 30.0 - 36.0 g/dL   RDW 87.5 64.3 - 32.9 %   Platelets 274 150 - 400 K/uL   nRBC 0.0 0.0 - 0.2 %   Neutrophils Relative % 47 %   Neutro Abs 2.2 1.7 - 7.7 K/uL   Lymphocytes Relative 42 %   Lymphs Abs 1.9 0.7 - 4.0 K/uL   Monocytes Relative 11 %   Monocytes Absolute 0.5 0.1 - 1.0 K/uL   Eosinophils Relative 0 %   Eosinophils Absolute 0.0 0.0 - 0.5 K/uL   Basophils Relative 0 %   Basophils Absolute 0.0 0.0 - 0.1 K/uL   Immature Granulocytes 0 %   Abs Immature Granulocytes 0.01 0.00 - 0.07 K/uL    Comment: Performed at [facility], [address] [place], [place] [address]  Lipase, blood     Status: None   Collection Time: 04/27/23 12:59 PM  Result Value Ref Range   Lipase 19 11  - 51 U/L    Comment: Performed at [facility], [address] [place], [place] [address]  Urine Drug Screen, Qualitative  Status: Abnormal   Collection Time: 04/27/23  1:32 PM  Result Value Ref Range   Tricyclic, Ur Screen NONE DETECTED NONE DETECTED   Amphetamines, Ur Screen NONE DETECTED NONE DETECTED   MDMA (Ecstasy)Ur Screen NONE DETECTED NONE DETECTED   Cocaine Metabolite,Ur [place] NONE DETECTED NONE DETECTED   Opiate, Ur Screen NONE DETECTED NONE DETECTED   Phencyclidine (PCP) Ur S NONE DETECTED NONE DETECTED   Cannabinoid 50 Ng, Ur [place] NONE DETECTED NONE DETECTED   Barbiturates, Ur Screen NONE DETECTED NONE DETECTED   Benzodiazepine, Ur Scrn POSITIVE (A) NONE DETECTED   Methadone Scn, Ur NONE DETECTED NONE DETECTED    Comment: (NOTE) Tricyclics + metabolites, urine    Cutoff 1000 ng/mL Amphetamines + metabolites, urine  Cutoff 1000 ng/mL MDMA (Ecstasy), urine              Cutoff 500 ng/mL Cocaine Metabolite, urine          Cutoff 300 ng/mL Opiate + metabolites, urine        Cutoff 300 ng/mL Phencyclidine (PCP), urine         Cutoff 25 ng/mL Cannabinoid, urine                 Cutoff 50 ng/mL Barbiturates + metabolites, urine  Cutoff 200 ng/mL Benzodiazepine, urine              Cutoff 200 ng/mL Methadone, urine                   Cutoff 300 ng/mL  The urine drug screen provides only a preliminary, unconfirmed analytical test result and should not be used for non-medical purposes. Clinical consideration and professional judgment should be applied to any positive drug screen result due to possible interfering substances. A more specific alternate chemical method must be used in order to obtain a confirmed analytical result. Gas chromatography / mass spectrometry (GC/MS) is the preferred confirm atory method. Performed at [facility], [address] [place], [place] [address]   Urinalysis, w/ Reflex to Culture (Infection Suspected) -Urine, Clean Catch      Status: Abnormal   Collection Time: 04/28/23 12:44 PM  Result Value Ref Range   Specimen Source URINE, CLEAN CATCH    Color, Urine YELLOW (A) YELLOW   APPearance CLEAR (A) CLEAR   Specific Gravity, Urine 1.021 1.005 - 1.030   pH 6.0 5.0 - 8.0   Glucose, UA NEGATIVE NEGATIVE mg/dL   Hgb urine dipstick NEGATIVE NEGATIVE   Bilirubin Urine NEGATIVE NEGATIVE   Ketones, ur 20 (A) NEGATIVE mg/dL   Protein, ur NEGATIVE NEGATIVE mg/dL   Nitrite NEGATIVE NEGATIVE   Leukocytes,Ua NEGATIVE NEGATIVE   RBC / HPF 0-5 0 - 5 RBC/hpf   WBC, UA 0 0 - 5 WBC/hpf    Comment:        Reflex urine culture not performed if WBC <=10, OR if Squamous epithelial cells >5. If Squamous epithelial cells >5 suggest recollection.    Bacteria, UA RARE (A) NONE SEEN   Squamous Epithelial / HPF 0-5 0 - 5 /HPF   Mucus PRESENT     Comment: Performed at [facility], [address] [place], [place] [address]    Current Facility-Administered Medications  Medication Dose Route Frequency Provider Last Rate Last Admin   gabapentin (NEURONTIN) capsule 200 mg  200 mg Oral TID PRN Charm Rings, NP       Current Outpatient Medications  Medication Sig Dispense Refill   diazepam (VALIUM)  2 MG tablet Take 1 tablet (2 mg total) by mouth every 8 (eight) hours as needed for anxiety. 10 tablet 0   ibuprofen (ADVIL,MOTRIN) 800 MG tablet Take 1 tablet (800 mg total) by mouth every 8 (eight) hours as needed for moderate pain. 30 tablet 0   loratadine (CLARITIN) 10 MG tablet Take 10 mg by mouth daily.      Musculoskeletal: Strength & Muscle Tone: within normal limits Gait & Station: normal Patient leans: N/A  Psychiatric Specialty Exam: Physical Exam Vitals and nursing note reviewed.  Constitutional:      Appearance: Normal appearance.  HENT:     Head: Normocephalic.     Nose: Nose normal.  Pulmonary:     Effort: Pulmonary effort is normal.  Musculoskeletal:        General: Normal range of motion.      Cervical back: Normal range of motion.  Neurological:     General: No focal deficit present.     Mental Status: He is alert and oriented to person, place, and time.     Review of Systems  All other systems reviewed and are negative.   Blood pressure (!) 148/99, pulse 99, temperature 98.2 F (36.8 C), temperature source Oral, resp. rate 18, height 5\' 7"  (1.702 m), weight 67.1 kg, SpO2 99%.Body mass index is 23.17 kg/m.  General Appearance: Casual  Eye Contact:  Fair  Speech:  Normal Rate  Volume:  Increased  Mood:  denies any issues  Affect:  smiling  Thought Process:  Coherent  Orientation:  Full (Time, Place, and Person)  Thought Content:  Logical  Suicidal Thoughts:  No  Homicidal Thoughts:  No  Memory:  Immediate;   Poor Recent;   Poor Remote;   Poor  Judgement:  Impaired  Insight:  Lacking  Psychomotor Activity:  WDL  Concentration:  Concentration: Poor and Attention Span: Poor  Recall:  Poor  Fund of Knowledge:  Poor  Language:  Fair  Akathisia:  No  Handed:  Right  AIMS (if indicated):     Assets:  Housing Leisure Time Physical Health Resilience Social Support  ADL's:  Intact  Cognition:  Impaired,  Moderate  Sleep:        Physical Exam: Physical Exam Vitals and nursing note reviewed.  Constitutional:      Appearance: Normal appearance.  HENT:     Head: Normocephalic.     Nose: Nose normal.  Pulmonary:     Effort: Pulmonary effort is normal.  Musculoskeletal:        General: Normal range of motion.     Cervical back: Normal range of motion.  Neurological:     General: No focal deficit present.     Mental Status: He is alert and oriented to person, place, and time.    Review of Systems  All other systems reviewed and are negative.  Blood pressure (!) 148/99, pulse 99, temperature 98.2 F (36.8 C), temperature source Oral, resp. rate 18, height 5\' 7"  (1.702 m), weight 67.1 kg, SpO2 99%. Body mass index is 23.17 kg/m.  Treatment Plan  Summary: Daily contact with patient to assess and evaluate symptoms and progress in treatment, Medication management, and Plan : Autism with behavioral changes: Gabapentin 200 mg TID PRN started, Rx ordered.  Disposition: Discharge to the care of his mother, follow up with outpatient provider  Nanine Means, NP 04/29/2023 10:10 AM

## 2023-04-29 NOTE — ED Notes (Signed)
Called pt's family again to see where they were, and family stated that they were "just getting into [place] and they aren't too far away."

## 2023-04-29 NOTE — ED Notes (Signed)
Tried calling pt's mother and father again with no success; was able to get pt's uncle on the phone and he is going to reach out to pt's mother and daughter and have them give me a call.

## 2023-04-29 NOTE — ED Notes (Signed)
Called pt's mother again with no success, and tried pt's uncle again with no success.

## 2023-04-29 NOTE — ED Provider Notes (Signed)
Patient cleared by Psychiatry for discharge. Guardian aware.   Shaune Pollack, MD 04/29/23 1034

## 2023-04-29 NOTE — ED Provider Notes (Signed)
Emergency Medicine Observation Re-evaluation Note  Physical Exam   BP 133/68 (BP Location: Right Arm)   Pulse 85   Temp 99.4 F (37.4 C) (Oral)   Resp 17   Ht 5\' 7"  (1.702 m)   Wt 67.1 kg   SpO2 98%   BMI 23.17 kg/m   Patient appears in no acute distress.  ED Course / MDM   No reported events during my shift at the time of this note.   Pt is awaiting dispo from consultants   Pilar Jarvis MD    Pilar Jarvis, MD 04/29/23 [phone]

## 2023-04-29 NOTE — ED Notes (Signed)
Spoke with pt's sister and her and pt's mom work in areas right near each other. They will be here to pick the pt up before 5pm today.

## 2023-04-29 NOTE — ED Notes (Signed)
Pt given drink at this time

## 2023-04-29 NOTE — ED Notes (Signed)
Tried calling all 3 emergency contacts for this pt with no success. Left a message for mom Silvio Pate Girten) to give me a call back.

## 2023-04-29 NOTE — ED Notes (Signed)
Water given

## 2023-05-10 ENCOUNTER — Other Ambulatory Visit: Payer: Self-pay

## 2023-05-10 ENCOUNTER — Emergency Department (HOSPITAL_COMMUNITY): Payer: MEDICAID

## 2023-05-10 ENCOUNTER — Emergency Department (HOSPITAL_COMMUNITY)
Admission: EM | Admit: 2023-05-10 | Discharge: 2023-05-10 | Disposition: A | Discharge status: Home / Self Care | Payer: MEDICAID | Attending: Emergency Medicine | Admitting: Emergency Medicine

## 2023-05-10 ENCOUNTER — Encounter (HOSPITAL_COMMUNITY): Payer: Self-pay | Admitting: Emergency Medicine

## 2023-05-10 DIAGNOSIS — K625 Hemorrhage of anus and rectum: Secondary | ICD-10-CM | POA: Diagnosis present

## 2023-05-10 DIAGNOSIS — F840 Autistic disorder: Secondary | ICD-10-CM | POA: Diagnosis not present

## 2023-05-10 DIAGNOSIS — K611 Rectal abscess: Secondary | ICD-10-CM | POA: Insufficient documentation

## 2023-05-10 LAB — COMPREHENSIVE METABOLIC PANEL
ALT: 23 U/L (ref 0–44)
AST: 21 U/L (ref 15–41)
Albumin: 3.8 g/dL (ref 3.5–5.0)
Alkaline Phosphatase: 115 U/L (ref 38–126)
Anion gap: 13 (ref 5–15)
BUN: 10 mg/dL (ref 6–20)
CO2: 24 mmol/L (ref 22–32)
Calcium: 8.7 mg/dL — ABNORMAL LOW (ref 8.9–10.3)
Chloride: 98 mmol/L (ref 98–111)
Creatinine, Ser: 0.69 mg/dL (ref 0.61–1.24)
GFR, Estimated: >60 mL/min (ref >60)
Glucose, Bld: 87 mg/dL (ref 70–99)
Potassium: 3.8 mmol/L (ref 3.5–5.1)
Sodium: 135 mmol/L (ref 135–145)
Total Bilirubin: 1.2 mg/dL (ref 0.3–1.2)
Total Protein: 7 g/dL (ref 6.5–8.1)

## 2023-05-10 LAB — CBC
HCT: 46.4 % (ref 39.0–52.0)
Hemoglobin: 15.5 g/dL (ref 13.0–17.0)
MCH: 26.6 pg (ref 26.0–34.0)
MCHC: 33.4 g/dL (ref 30.0–36.0)
MCV: 79.7 fL — ABNORMAL LOW (ref 80.0–100.0)
Platelets: 271 10*3/uL (ref 150–400)
RBC: 5.82 MIL/uL — ABNORMAL HIGH (ref 4.22–5.81)
RDW: 12.5 % (ref 11.5–15.5)
WBC: 10.3 10*3/uL (ref 4.0–10.5)
nRBC: 0 % (ref 0.0–0.2)

## 2023-05-10 LAB — TYPE AND SCREEN
ABO/RH(D): O POS
Antibody Screen: NEGATIVE

## 2023-05-10 MED ORDER — NAPROXEN 500 MG PO TABS: 500.0000 mg | ORAL_TABLET | Freq: Two times a day (BID) | ORAL | 0 refills | Status: AC

## 2023-05-10 MED ORDER — LIDOCAINE HCL (PF) 2 % IJ SOLN
INTRAMUSCULAR | Status: AC
  Administered 2023-05-10: 5 mL
  Filled 2023-05-10: qty 5

## 2023-05-10 MED ORDER — MIDAZOLAM HCL 2 MG/2ML IJ SOLN
4.0000 mg | Freq: Once | INTRAMUSCULAR | Status: AC
  Administered 2023-05-10: 2 mg via INTRAVENOUS
  Filled 2023-05-10: qty 4

## 2023-05-10 MED ORDER — FENTANYL CITRATE (PF) 100 MCG/2ML IJ SOLN
100.0000 ug | Freq: Once | INTRAMUSCULAR | Status: AC
  Administered 2023-05-10: 50 ug via INTRAVENOUS
  Filled 2023-05-10: qty 2

## 2023-05-10 MED ORDER — IOHEXOL 300 MG/ML  SOLN
100.0000 mL | Freq: Once | INTRAMUSCULAR | Status: AC | PRN
  Administered 2023-05-10: 100 mL via INTRAVENOUS

## 2023-05-10 MED ORDER — VANCOMYCIN HCL IN DEXTROSE 1-5 GM/200ML-% IV SOLN
1000.0000 mg | Freq: Once | INTRAVENOUS | Status: AC
  Administered 2023-05-10: 1000 mg via INTRAVENOUS
  Filled 2023-05-10: qty 200

## 2023-05-10 MED ORDER — DOXYCYCLINE HYCLATE 100 MG PO CAPS: 100.0000 mg | ORAL_CAPSULE | Freq: Two times a day (BID) | ORAL | 0 refills | Status: AC

## 2023-05-10 MED ORDER — KETAMINE HCL 50 MG/5ML IJ SOSY
1.0000 mg/kg | PREFILLED_SYRINGE | Freq: Once | INTRAMUSCULAR | Status: AC
  Administered 2023-05-10: 67 mg via INTRAVENOUS
  Filled 2023-05-10: qty 10

## 2023-05-10 NOTE — ED Triage Notes (Signed)
Pt presents with rectal bleeding that started on yesterday per mom, pt d/c from [facility] for anxiety and prescribed motrin and valium.

## 2023-05-10 NOTE — ED Notes (Signed)
Pt was talking to someone, telling them to "get outta here", yelling, punching the air.

## 2023-05-10 NOTE — Sedation Documentation (Signed)
Fentanyl 50 mcg given at 1657 Versed 2 mg given at 1659 Ketamine 50 mcg given at 1700

## 2023-05-10 NOTE — Discharge Instructions (Signed)
Doxycycline is an antibiotic which is taken twice a day, this treats bacterial infections that can cause staph infections, it treats sinus infections and some pneumonia. It also treats tick infections like Lyme's disease.  In this case I would like for you to take the antibiotic exactly as prescribed until it is completed.  Please be aware that occasionally people will get a rash if they are in the sunlight for extended periods of time while taking this medicine.  Please have your doctor recheck the wound in 3 days, they can pull out the packing at that time.  Keep it covered and dry for the next 3 days, take the antibiotic exactly as prescribed  Emergency department for severe or worsening pain or fever, there will be bleeding, that is okay, change the dressing as needed for when the bleeding happens.  At least twice a day

## 2023-05-10 NOTE — ED Provider Notes (Signed)
[name] Provider Note   CSN: [id number] Arrival date & time: 05/10/23  1333     History  Chief Complaint  Patient presents with   Rectal Bleeding    Fred Harris is a 27 y.o. male.   Rectal Bleeding  This patient is a 27 year old male presenting to the hospital today with a complaint of some type of bleeding.  The mother is the primary historian as the patient has autism, level 5 caveat applies.  The patient was recently seen at an outside hospital and prescribed both Valium and ibuprofen for combination of some abdominal discomfort and anxiety.  The report from family is that there has been some blood on his close for the last 2 days    Home Medications Prior to Admission medications   Medication Sig Start Date End Date Taking? Authorizing Provider  doxycycline (VIBRAMYCIN) 100 MG capsule Take 1 capsule (100 mg total) by mouth 2 (two) times daily. 05/10/23  Yes Eber Hong, MD  naproxen (NAPROSYN) 500 MG tablet Take 1 tablet (500 mg total) by mouth 2 (two) times daily with a meal. 05/10/23  Yes Eber Hong, MD  diazepam (VALIUM) 2 MG tablet Take 1 tablet (2 mg total) by mouth every 8 (eight) hours as needed for anxiety. 04/20/23   Sloan Leiter, DO  gabapentin (NEURONTIN) 100 MG capsule Take 2 capsules (200 mg total) by mouth 3 (three) times daily as needed (anxiety). 04/29/23   Charm Rings, NP  ibuprofen (ADVIL,MOTRIN) 800 MG tablet Take 1 tablet (800 mg total) by mouth every 8 (eight) hours as needed for moderate pain. 12/15/14   Tommi Rumps, PA-C  loratadine (CLARITIN) 10 MG tablet Take 10 mg by mouth daily. 11/30/22   [provider]      Allergies    Patient has no known allergies.    Review of Systems   Review of Systems  Unable to perform ROS: Mental status change  Gastrointestinal:  Positive for hematochezia.    Physical Exam Updated Vital Signs BP 138/87   Pulse (!) 112   Temp 99.3 F (37.4  C) (Rectal)   Resp (!) 24   Ht 1.702 m (5\' 7" )   Wt 66.7 kg   SpO2 99%   BMI 23.03 kg/m  Physical Exam Vitals and nursing note reviewed.  Constitutional:      General: He is not in acute distress.    Appearance: He is well-developed.  HENT:     Head: Normocephalic and atraumatic.     Mouth/Throat:     Pharynx: No oropharyngeal exudate.  Eyes:     General: No scleral icterus.       Right eye: No discharge.        Left eye: No discharge.     Conjunctiva/sclera: Conjunctivae normal.     Pupils: Pupils are equal, round, and reactive to light.  Neck:     Thyroid: No thyromegaly.     Vascular: No JVD.  Cardiovascular:     Rate and Rhythm: Normal rate and regular rhythm.     Heart sounds: Normal heart sounds. No murmur heard.    No friction rub. No gallop.  Pulmonary:     Effort: Pulmonary effort is normal. No respiratory distress.     Breath sounds: Normal breath sounds. No wheezing or rales.  Abdominal:     General: Bowel sounds are normal. There is no distension.     Palpations: Abdomen is soft.  There is no mass.     Tenderness: There is no abdominal tenderness.  Genitourinary:    Comments: Chaperone present for exam, patient has what appears to be a fluctuant spontaneously draining abscess in the left gluteal area in the perirectal region.  Visualized parts of the scrotum appear normal Musculoskeletal:        General: No tenderness. Normal range of motion.     Cervical back: Normal range of motion and neck supple.  Lymphadenopathy:     Cervical: No cervical adenopathy.  Skin:    General: Skin is warm and dry.     Findings: No erythema or rash.  Neurological:     Mental Status: He is alert.     Coordination: Coordination normal.     Comments: Patient moves all 4 extremities  Psychiatric:        Behavior: Behavior normal.     ED Results / Procedures / Treatments   Labs (all labs ordered are listed, but only abnormal results are displayed) Labs Reviewed   COMPREHENSIVE METABOLIC PANEL - Abnormal; Notable for the following components:      Result Value   Calcium 8.7 (*)    All other components within normal limits  CBC - Abnormal; Notable for the following components:   RBC 5.82 (*)    MCV 79.7 (*)    All other components within normal limits  POC OCCULT BLOOD, ED  TYPE AND SCREEN    EKG EKG Interpretation Date/Time:  Saturday May 10 2023 16:29:23 EDT Ventricular Rate:  112 PR Interval:  126 QRS Duration:  83 QT Interval:  336 QTC Calculation: 459 R Axis:   50  Text Interpretation: Sinus tachycardia no other significant findings Confirmed by Eber Hong ([id number]) on 05/10/2023 4:39:10 PM  Radiology CT PELVIS W CONTRAST  Result Date: 05/10/2023 CLINICAL DATA:  Abdominal abscess (Ped 0-17y) L perirectal abscess. rectal bleeding that started on yesterday per mom, pt d/c from [place] for anxiety and prescribed motrin and valium. EXAM: CT PELVIS WITH CONTRAST TECHNIQUE: Multidetector CT imaging of the pelvis was performed using the standard protocol following the bolus administration of intravenous contrast. RADIATION DOSE REDUCTION: This exam was performed according to the departmental dose-optimization program which includes automated exposure control, adjustment of the mA and/or kV according to patient size and/or use of iterative reconstruction technique. CONTRAST:  OMNIPAQUE IOHEXOL 300 MG/ML  SOLN COMPARISON:  CT abdomen pelvis 08/15/2016 FINDINGS: Urinary Tract:  No abnormality visualized. Bowel:  Unremarkable visualized pelvic bowel loops. Vascular/Lymphatic: No pathologically enlarged lymph nodes. No significant vascular abnormality seen. Reproductive:  No mass or other significant abnormality Other: No intraperitoneal free fluid. No intraperitoneal free gas. No organized fluid collection. Musculoskeletal: Left posterior subcutaneus soft tissue perirectal fluid collection measuring 4.5 x 1.5 x 4 cm with associated thick  walls. Overlying subcutaneus soft tissue fat stranding and dermal thickening. Finding extends along the medial left gluteal cleft. No suspicious lytic or blastic osseous lesions. No acute displaced fracture. Multilevel degenerative changes of the spine. IMPRESSION: Left posterior perirectal 4.5 x 1.5 cm abscess formation. Electronically Signed   By: Tish Frederickson M.D.   On: 05/10/2023 17:29    Procedures .1-3 Lead EKG Interpretation  Performed by: Eber Hong, MD Authorized by: Eber Hong, MD     ECG rate:  107 bpm   ECG rate assessment: tachycardic     Rhythm: sinus tachycardia     Ectopy: none     Conduction: normal   Comments:  Unremarkable sinus tachycardia .Sedation  Date/Time: 05/10/2023 5:11 PM  Performed by: Eber Hong, MD Authorized by: Eber Hong, MD   Consent:    Consent obtained:  Written and verbal   Consent given by:  Parent   Risks discussed:  Allergic reaction, dysrhythmia, inadequate sedation, prolonged hypoxia resulting in organ damage, prolonged sedation necessitating reversal, respiratory compromise necessitating ventilatory assistance and intubation, nausea and vomiting   Alternatives discussed:  Analgesia without sedation and regional anesthesia Universal protocol:    Procedure explained and questions answered to patient or proxy's satisfaction: yes     Immediately prior to procedure, a time out was called: yes     Patient identity confirmed:  Provided demographic data, verbally with patient and arm band Indications:    Procedure performed:  Incision and drainage   Procedure necessitating sedation performed by:  Physician performing sedation Pre-sedation assessment:    Time since last food or drink:  4 hours   ASA classification: class 1 - normal, healthy patient     Mouth opening:  3 or more finger widths   Thyromental distance:  4 finger widths   Mallampati score:  I - soft palate, uvula, fauces, pillars visible   Neck mobility: normal      Pre-sedation assessments completed and reviewed: airway patency, cardiovascular function, hydration status, mental status, nausea/vomiting, pain level, respiratory function and temperature     Pre-sedation assessment completed:  05/10/2023 4:50 PM Immediate pre-procedure details:    Reassessment: Patient reassessed immediately prior to procedure     Reviewed: vital signs, relevant labs/tests and NPO status     Verified: bag valve mask available, emergency equipment available, intubation equipment available, IV patency confirmed, oxygen available and reversal medications available   Procedure details (see MAR for exact dosages):    Preoxygenation:  Nasal cannula   Sedation:  Midazolam and ketamine   Intended level of sedation: deep   Analgesia:  Fentanyl   Intra-procedure monitoring:  Blood pressure monitoring, cardiac monitor, continuous capnometry, continuous pulse oximetry, frequent LOC assessments and frequent vital sign checks   Intra-procedure events: none     Total Provider sedation time (minutes):  12 Post-procedure details:    Post-sedation assessment completed:  05/10/2023 5:12 PM   Attendance: Constant attendance by certified staff until patient recovered     Recovery: Patient returned to pre-procedure baseline     Post-sedation assessments completed and reviewed: airway patency, cardiovascular function, hydration status, mental status, nausea/vomiting, pain level, respiratory function and temperature     Patient is stable for discharge or admission: yes     Procedure completion:  Tolerated well, no immediate complications Comments:       Marland KitchenMarland KitchenIncision and Drainage  Date/Time: 05/10/2023 5:12 PM  Performed by: Eber Hong, MD Authorized by: Eber Hong, MD   Consent:    Consent obtained:  Verbal and written   Consent given by:  Patient and parent   Risks, benefits, and alternatives were discussed: yes     Risks discussed:  Bleeding, incomplete drainage, infection, pain and  damage to other organs   Alternatives discussed:  Alternative treatment Universal protocol:    Procedure explained and questions answered to patient or proxy's satisfaction: yes     Relevant documents present and verified: yes     Test results available : yes     Imaging studies available: yes     Required blood products, implants, devices, and special equipment available: yes     Site/side marked: yes     Immediately  prior to procedure, a time out was called: yes     Patient identity confirmed:  Arm band and provided demographic data Location:    Type:  Abscess   Size:  Large   Location:  Anogenital   Anogenital location:  Perianal Pre-procedure details:    Skin preparation:  Povidone-iodine Sedation:    Sedation type:  Deep Anesthesia:    Anesthesia method:  Local infiltration   Local anesthetic:  Lidocaine 1% w/o epi Procedure type:    Complexity:  Complex Procedure details:    Incision types:  Single straight   Incision depth:  Submucosal   Wound management:  Probed and deloculated, irrigated with saline and extensive cleaning   Drainage:  Bloody and purulent   Drainage amount:  Copious   Wound treatment:  Wound left open   Packing materials:  1/4 in iodoform gauze Post-procedure details:    Procedure completion:  Tolerated well, no immediate complications Comments:           Medications Ordered in ED Medications  vancomycin (VANCOCIN) IVPB 1000 mg/200 mL premix (0 mg Intravenous Stopped 05/10/23 1636)  iohexol (OMNIPAQUE) 300 MG/ML solution 100 mL (100 mLs Intravenous Contrast Given 05/10/23 1550)  ketamine 50 mg in normal saline 5 mL (10 mg/mL) syringe (67 mg Intravenous Given 05/10/23 1708)  fentaNYL (SUBLIMAZE) injection 100 mcg (50 mcg Intravenous Given 05/10/23 1710)  midazolam (VERSED) injection 4 mg (2 mg Intravenous Given 05/10/23 1710)  lidocaine HCl (PF) (XYLOCAINE) 2 % injection (5 mLs  Given 05/10/23 1709)    ED Course/ Medical Decision Making/ A&P                                  Medical Decision Making Amount and/or Complexity of Data Reviewed Labs: ordered. Radiology: ordered.  Risk Prescription drug management.    This patient presents to the ED for concern of possible abscess versus rectal bleeding, this involves an extensive number of treatment options, and is a complaint that carries with it a high risk of complications and morbidity.  The differential diagnosis includes perirectal abscess, pilonidal, perirectal, rectal bleeding, constipation   Co morbidities that complicate the patient evaluation  Autism   Additional history obtained:  Additional history obtained from medical record External records from outside source obtained and reviewed including prior mental health evaluation with prescription of NSAIDs and Valium   Lab Tests:  I Ordered, and personally interpreted labs.  The pertinent results include: No leukocytosis, metabolic panel is reassuring, no acidosis or anion gap   Imaging Studies ordered:  I ordered imaging studies including CT scan of the pelvis with contrast I independently visualized and interpreted imaging which showed abscess in the gluteal tissues in the perianal area I agree with the radiologist interpretation   Cardiac Monitoring: / EKG:  The patient was maintained on a cardiac monitor.  I personally viewed and interpreted the cardiac monitored which showed an underlying rhythm of: Mild sinus tachycardia    Problem List / ED Course / Critical interventions / Medication management  Prior to the incision and drainage I had a long discussion with the mother and the patient regarding the risk benefits and alternatives of the procedure.  Included in the risk was the potential damage to structures around the perianal area including musculature or deeper injury.  The mother gave permission and consent both verbal and written.  After the incision only purulent material and some blood  came out.   There was no stool, this wound was probed and irrigated and deloculated. I ordered medication including doxycycline, ketamine, midazolam, fentanyl reevaluation of the patient after these medicines showed that the patient significant improvement I have reviewed the patients home medicines and have made adjustments as needed   Social Determinants of Health:  Autism   Test / Admission - Considered:  Considered admission but the patient is afebrile with no leukocytosis, tolerated incision and drainage, source control was obtained and the patient is stable for discharge, mother agreeable         Final Clinical Impression(s) / ED Diagnoses Final diagnoses:  Perirectal abscess    Rx / DC Orders ED Discharge Orders          Ordered    naproxen (NAPROSYN) 500 MG tablet  2 times daily with meals        05/10/23 1822    doxycycline (VIBRAMYCIN) 100 MG capsule  2 times daily        05/10/23 Vanessa [place], MD 05/10/23 [phone]

## 2023-12-02 ENCOUNTER — Ambulatory Visit: Admit: 2023-12-02 | Payer: MEDICAID

## 2024-01-21 ENCOUNTER — Emergency Department (HOSPITAL_COMMUNITY)
Admission: EM | Admit: 2024-01-21 | Discharge: 2024-01-21 | Disposition: A | Discharge status: Home / Self Care | Payer: MEDICAID | Attending: Emergency Medicine | Admitting: Emergency Medicine

## 2024-01-21 ENCOUNTER — Encounter (HOSPITAL_COMMUNITY): Payer: Self-pay

## 2024-01-21 ENCOUNTER — Other Ambulatory Visit: Payer: Self-pay

## 2024-01-21 ENCOUNTER — Emergency Department (HOSPITAL_COMMUNITY): Payer: MEDICAID

## 2024-01-21 DIAGNOSIS — R634 Abnormal weight loss: Secondary | ICD-10-CM | POA: Diagnosis present

## 2024-01-21 DIAGNOSIS — F840 Autistic disorder: Secondary | ICD-10-CM | POA: Insufficient documentation

## 2024-01-21 DIAGNOSIS — R5383 Other fatigue: Secondary | ICD-10-CM | POA: Diagnosis not present

## 2024-01-21 LAB — CBC WITH DIFFERENTIAL/PLATELET
Abs Immature Granulocytes: 0.01 10*3/uL (ref 0.00–0.07)
Basophils Absolute: 0 10*3/uL (ref 0.0–0.1)
Basophils Relative: 0 %
Eosinophils Absolute: 0.1 10*3/uL (ref 0.0–0.5)
Eosinophils Relative: 2 %
HCT: 41 % (ref 39.0–52.0)
Hemoglobin: 13.8 g/dL (ref 13.0–17.0)
Immature Granulocytes: 0 %
Lymphocytes Relative: 49 %
Lymphs Abs: 2.9 10*3/uL (ref 0.7–4.0)
MCH: 26.6 pg (ref 26.0–34.0)
MCHC: 33.7 g/dL (ref 30.0–36.0)
MCV: 79 fL — ABNORMAL LOW (ref 80.0–100.0)
Monocytes Absolute: 0.7 10*3/uL (ref 0.1–1.0)
Monocytes Relative: 12 %
Neutro Abs: 2.2 10*3/uL (ref 1.7–7.7)
Neutrophils Relative %: 37 %
Platelets: 233 10*3/uL (ref 150–400)
RBC: 5.19 MIL/uL (ref 4.22–5.81)
RDW: 12.6 % (ref 11.5–15.5)
WBC: 5.9 10*3/uL (ref 4.0–10.5)
nRBC: 0 % (ref 0.0–0.2)

## 2024-01-21 LAB — URINALYSIS, ROUTINE W REFLEX MICROSCOPIC
Bilirubin Urine: NEGATIVE
Glucose, UA: NEGATIVE mg/dL
Hgb urine dipstick: NEGATIVE
Ketones, ur: NEGATIVE mg/dL
Leukocytes,Ua: NEGATIVE
Nitrite: NEGATIVE
Protein, ur: NEGATIVE mg/dL
Specific Gravity, Urine: 1.02 (ref 1.005–1.030)
pH: 6 (ref 5.0–8.0)

## 2024-01-21 LAB — RAPID URINE DRUG SCREEN, HOSP PERFORMED
Amphetamines: NOT DETECTED
Barbiturates: NOT DETECTED
Benzodiazepines: NOT DETECTED
Cocaine: NOT DETECTED
Opiates: NOT DETECTED
Tetrahydrocannabinol: NOT DETECTED

## 2024-01-21 LAB — COMPREHENSIVE METABOLIC PANEL WITH GFR
ALT: 44 U/L (ref 0–44)
AST: 24 U/L (ref 15–41)
Albumin: 3.5 g/dL (ref 3.5–5.0)
Alkaline Phosphatase: 85 U/L (ref 38–126)
Anion gap: 9 (ref 5–15)
BUN: 11 mg/dL (ref 6–20)
CO2: 26 mmol/L (ref 22–32)
Calcium: 8.8 mg/dL — ABNORMAL LOW (ref 8.9–10.3)
Chloride: 103 mmol/L (ref 98–111)
Creatinine, Ser: 0.64 mg/dL (ref 0.61–1.24)
GFR, Estimated: >60 mL/min (ref >60)
Glucose, Bld: 94 mg/dL (ref 70–99)
Potassium: 3.9 mmol/L (ref 3.5–5.1)
Sodium: 138 mmol/L (ref 135–145)
Total Bilirubin: 0.6 mg/dL (ref 0.0–1.2)
Total Protein: 6.1 g/dL — ABNORMAL LOW (ref 6.5–8.1)

## 2024-01-21 NOTE — ED Triage Notes (Addendum)
 Pt bib mother for fatigue, states that he has been sleeping more, mother also request that his chest be looked at.Pt  Pt is nonverbal, mother present.

## 2024-01-21 NOTE — ED Notes (Signed)
 ED Provider at bedside.

## 2024-01-22 NOTE — ED Provider Notes (Signed)
 [place] EMERGENCY DEPARTMENT AT [facility] Provider Note   CSN: [id number] Arrival date & time: 01/21/24  2000     Patient presents with: Fatigue   Fred Harris is a 28 y.o. male.   Brought in by mother for two issues. She states he has been sleeping more than normal recently. Especially after meals. States he has lost weight and is 'pulling at his chest' which she points out as meaning the she can see his ribs around his sternum where she couldn't see them before. When asked specifically if she has noticed any abnormal breathing or retractions, she denies. No fevers. No vomiting, diarrhea, constipation, trauma or other associated symptoms. Has autism and is not able to communicate too well. She states otherwise he is 'real good'. Has seen pcp for this already but mother's husband told her she needed to get him checked out again tonight and thus came here.         Prior to Admission medications   Medication Sig Start Date End Date Taking? Authorizing Provider  diazepam  (VALIUM ) 2 MG tablet Take 1 tablet (2 mg total) by mouth every 8 (eight) hours as needed for anxiety. 04/20/23   Teddi Favors, DO  doxycycline  (VIBRAMYCIN ) 100 MG capsule Take 1 capsule (100 mg total) by mouth 2 (two) times daily. 05/10/23   Early Glisson, MD  gabapentin  (NEURONTIN ) 100 MG capsule Take 2 capsules (200 mg total) by mouth 3 (three) times daily as needed (anxiety). 04/29/23   Lissa Riding, NP  ibuprofen  (ADVIL ,MOTRIN ) 800 MG tablet Take 1 tablet (800 mg total) by mouth every 8 (eight) hours as needed for moderate pain. 12/15/14   Stafford Eagles, PA-C  loratadine (CLARITIN) 10 MG tablet Take 10 mg by mouth daily. 11/30/22   [provider]  naproxen  (NAPROSYN ) 500 MG tablet Take 1 tablet (500 mg total) by mouth 2 (two) times daily with a meal. 05/10/23   Early Glisson, MD    Allergies: Patient has no known allergies.    Review of Systems  Updated Vital Signs BP 120/60 (BP  Location: Right Arm)   Pulse 87   Temp 98.5 F (36.9 C) (Oral)   Resp 17   Ht 5' 7 (1.702 m)   Wt 66.7 kg   SpO2 97%   BMI 23.03 kg/m   Physical Exam Vitals and nursing note reviewed.  Constitutional:      Appearance: Normal appearance. He is well-developed.  HENT:     Head: Normocephalic and atraumatic.     Mouth/Throat:     Mouth: Mucous membranes are moist.   Cardiovascular:     Rate and Rhythm: Normal rate.  Pulmonary:     Effort: Pulmonary effort is normal. No respiratory distress.  Abdominal:     General: There is no distension.   Musculoskeletal:        General: Normal range of motion.     Cervical back: Normal range of motion.   Skin:    General: Skin is warm and dry.   Neurological:     Mental Status: He is alert. Mental status is at baseline.     (all labs ordered are listed, but only abnormal results are displayed) Labs Reviewed  CBC WITH DIFFERENTIAL/PLATELET - Abnormal; Notable for the following components:      Result Value   MCV 79.0 (*)    All other components within normal limits  COMPREHENSIVE METABOLIC PANEL WITH GFR - Abnormal; Notable for the following components:  Calcium 8.8 (*)    Total Protein 6.1 (*)    All other components within normal limits  URINALYSIS, ROUTINE W REFLEX MICROSCOPIC  RAPID URINE DRUG SCREEN, HOSP PERFORMED    EKG: None  Radiology: [facility] Chest Port 1 View Result Date: 01/21/2024 CLINICAL DATA:  Weakness. EXAM: PORTABLE CHEST 1 VIEW COMPARISON:  April 20, 2023 FINDINGS: The heart size and mediastinal contours are within normal limits. Both lungs are clear. The visualized skeletal structures are unremarkable. IMPRESSION: No active disease. Electronically Signed   By: Virgle Grime M.D.   On: 01/21/2024 22:16     Procedures   Medications Ordered in the ED - No data to display                                  Medical Decision Making  Appears to be at stated baseline. VS WNL. Exam is reassuring. Labs  reassuring. Will continue following up with PCP if not improving.      Final diagnoses:  Other fatigue  Weight loss    ED Discharge Orders     None          Manasi Dishon, Reymundo Caulk, MD 01/22/24 [phone]

## 2024-06-14 ENCOUNTER — Emergency Department
Admission: EM | Admit: 2024-06-14 | Discharge: 2024-06-14 | Disposition: A | Discharge status: Home / Self Care | Payer: MEDICAID | Attending: Emergency Medicine | Admitting: Emergency Medicine

## 2024-06-14 ENCOUNTER — Other Ambulatory Visit: Payer: Self-pay

## 2024-06-14 ENCOUNTER — Emergency Department: Payer: MEDICAID

## 2024-06-14 DIAGNOSIS — K529 Noninfective gastroenteritis and colitis, unspecified: Secondary | ICD-10-CM | POA: Insufficient documentation

## 2024-06-14 DIAGNOSIS — R112 Nausea with vomiting, unspecified: Secondary | ICD-10-CM | POA: Diagnosis present

## 2024-06-14 DIAGNOSIS — J45909 Unspecified asthma, uncomplicated: Secondary | ICD-10-CM | POA: Insufficient documentation

## 2024-06-14 DIAGNOSIS — F840 Autistic disorder: Secondary | ICD-10-CM | POA: Insufficient documentation

## 2024-06-14 LAB — URINALYSIS, ROUTINE W REFLEX MICROSCOPIC
Bilirubin Urine: NEGATIVE
Glucose, UA: NEGATIVE mg/dL
Hgb urine dipstick: NEGATIVE
Ketones, ur: NEGATIVE mg/dL
Leukocytes,Ua: NEGATIVE
Nitrite: NEGATIVE
Protein, ur: NEGATIVE mg/dL
Specific Gravity, Urine: >1.046 — ABNORMAL HIGH (ref 1.005–1.030)
pH: 5 (ref 5.0–8.0)

## 2024-06-14 LAB — COMPREHENSIVE METABOLIC PANEL WITH GFR
ALT: 45 U/L — ABNORMAL HIGH (ref 0–44)
AST: 30 U/L (ref 15–41)
Albumin: 4.5 g/dL (ref 3.5–5.0)
Alkaline Phosphatase: 153 U/L — ABNORMAL HIGH (ref 38–126)
Anion gap: 15 (ref 5–15)
BUN: 18 mg/dL (ref 6–20)
CO2: 21 mmol/L — ABNORMAL LOW (ref 22–32)
Calcium: 8.8 mg/dL — ABNORMAL LOW (ref 8.9–10.3)
Chloride: 104 mmol/L (ref 98–111)
Creatinine, Ser: 0.71 mg/dL (ref 0.61–1.24)
GFR, Estimated: >60 mL/min (ref >60)
Glucose, Bld: 121 mg/dL — ABNORMAL HIGH (ref 70–99)
Potassium: 4.2 mmol/L (ref 3.5–5.1)
Sodium: 140 mmol/L (ref 135–145)
Total Bilirubin: 0.9 mg/dL (ref 0.0–1.2)
Total Protein: 8 g/dL (ref 6.5–8.1)

## 2024-06-14 LAB — RESP PANEL BY RT-PCR (RSV, FLU A&B, COVID)  RVPGX2
Influenza A by PCR: NEGATIVE
Influenza B by PCR: NEGATIVE
Resp Syncytial Virus by PCR: NEGATIVE
SARS Coronavirus 2 by RT PCR: NEGATIVE

## 2024-06-14 LAB — LIPASE, BLOOD: Lipase: 20 U/L (ref 11–51)

## 2024-06-14 LAB — CBC
HCT: 50.9 % (ref 39.0–52.0)
Hemoglobin: 17.2 g/dL — ABNORMAL HIGH (ref 13.0–17.0)
MCH: 26.4 pg (ref 26.0–34.0)
MCHC: 33.8 g/dL (ref 30.0–36.0)
MCV: 78.2 fL — ABNORMAL LOW (ref 80.0–100.0)
Platelets: 253 K/uL (ref 150–400)
RBC: 6.51 MIL/uL — ABNORMAL HIGH (ref 4.22–5.81)
RDW: 12.9 % (ref 11.5–15.5)
WBC: 11.8 K/uL — ABNORMAL HIGH (ref 4.0–10.5)
nRBC: 0 % (ref 0.0–0.2)

## 2024-06-14 MED ORDER — ONDANSETRON HCL 4 MG/2ML IJ SOLN
4.0000 mg | Freq: Once | INTRAMUSCULAR | Status: AC
  Administered 2024-06-14: 4 mg via INTRAVENOUS
  Filled 2024-06-14: qty 2

## 2024-06-14 MED ORDER — ONDANSETRON 4 MG PO TBDP: 4.0000 mg | ORAL_TABLET | Freq: Three times a day (TID) | ORAL | 0 refills | Status: AC | PRN

## 2024-06-14 MED ORDER — AMOXICILLIN-POT CLAVULANATE 875-125 MG PO TABS: 1.0000 | ORAL_TABLET | Freq: Two times a day (BID) | ORAL | 0 refills | Status: AC

## 2024-06-14 MED ORDER — SODIUM CHLORIDE 0.9 % IV BOLUS
1000.0000 mL | Freq: Once | INTRAVENOUS | Status: AC
  Administered 2024-06-14: 1000 mL via INTRAVENOUS

## 2024-06-14 MED ORDER — IOHEXOL 300 MG/ML  SOLN
100.0000 mL | Freq: Once | INTRAMUSCULAR | Status: AC | PRN
  Administered 2024-06-14: 100 mL via INTRAVENOUS

## 2024-06-14 NOTE — ED Triage Notes (Signed)
 C/O vomiting x today. AOx3. Skin warm and dry. NAD

## 2024-06-14 NOTE — ED Provider Notes (Signed)
 [facility] Provider Note    Event Date/Time   First MD Initiated Contact with Patient 06/14/24 1550     (approximate)  History   Chief Complaint: Emesis  HPI  Fred Harris is a 28 y.o. male with a past medical history of anxiety, asthma, autism, presents to the emergency department with nausea vomiting diarrhea and abdominal pain.  According to the mother since earlier this morning patient has been experiencing nausea vomiting diarrhea has been complaining of some abdominal discomfort.  Mom states several days ago she had similar symptoms with nausea vomiting and diarrhea.  Borderline low-grade temperature on arrival 99.9.  Patient with dried vomitus on his shirt.  Physical Exam   Triage Vital Signs: ED Triage Vitals  Encounter Vitals Group     BP 06/14/24 1250 107/89     Girls Systolic BP Percentile --      Girls Diastolic BP Percentile --      Boys Systolic BP Percentile --      Boys Diastolic BP Percentile --      Pulse Rate 06/14/24 1250 (!) 143     Resp 06/14/24 1250 16     Temp 06/14/24 1250 99.9 F (37.7 C)     Temp Source 06/14/24 1250 Oral     SpO2 06/14/24 1250 96 %     Weight 06/14/24 1248 147 lb 0.8 oz (66.7 kg)     Height --      Head Circumference --      Peak Flow --      Pain Score 06/14/24 1248 0     Pain Loc --      Pain Education --      Exclude from Growth Chart --     Most recent vital signs: Vitals:   06/14/24 1530 06/14/24 1652  BP: (!) 141/103 (!) 144/89  Pulse: (!) 128 (!) 123  Resp: 18 18  Temp:  98.4 F (36.9 C)  SpO2: 95% 100%    General: Awake, no distress.  CV:  Good peripheral perfusion.  Regular rate and rhythm  Resp:  Normal effort.  Equal breath sounds bilaterally.  Abd:  No distention.  Soft, nontender.  No rebound or guarding.  ED Results / Procedures / Treatments   EKG  EKG viewed and interpreted by myself shows sinus tachycardia at 136 bpm with a narrow QRS, normal axis, normal intervals,  nonspecific ST changes without ST elevation  RADIOLOGY  I have reviewed interpreted CT images.  Patient appears to have some fluid-filled small bowel but no obvious obstruction or significant abnormality at least in my evaluation. Radiologist read the CT scan as mucosal thickening and fat stranding about the ascending and transverse colon suggesting nonspecific infectious or inflammatory colitis no other acute finding.  Normal appendix.   MEDICATIONS ORDERED IN ED: Medications  iohexol  (OMNIPAQUE ) 300 MG/ML solution 100 mL (100 mLs Intravenous Contrast Given 06/14/24 1600)  sodium chloride  0.9 % bolus 1,000 mL (1,000 mLs Intravenous New Bag/Given 06/14/24 1617)  ondansetron  (ZOFRAN ) injection 4 mg (4 mg Intravenous Given 06/14/24 1612)     IMPRESSION / MDM / ASSESSMENT AND PLAN / ED COURSE  I reviewed the triage vital signs and the nursing notes.  Patient's presentation is most consistent with acute presentation with potential threat to life or bodily function.  Patient presents to the emergency department for nausea vomiting diarrhea and abdominal discomfort.  Overall the patient appears well.  Benign abdomen on my exam no reaction to  abdominal palpation.  However given the patient's difficult history given autism we will obtain a CT scan as a precaution.  Mom states she had similar symptoms 2 to 3 days ago highly suspect more of a viral gastroenteritis.  Patient's lab work shows a slight leukocytosis some signs of hemoconcentration with a hemoglobin of 17 and anion gap of 15 on his chemistry suggesting dehydration.  Will IV hydrate, treat nausea with Zofran .  CT scan has resulted showing possibility of infectious or inflammatory colitis.  Patient's urinalysis has resulted with a somewhat higher specific gravity possibly indicating mild dehydration but no other acute finding.  Lipase resulted negative.  Given the patient CT scan showing possible colitis I believe a short course of  antibiotics would be warranted.  Patient states he feels better he has not had any vomiting in the emergency department since receiving Zofran .  Pulse rate remains elevated around 115-120 during my evaluation although the patient is somewhat agitated, mom states he appears much better to her and they are ready to go home.  I discussed with mom should the patient worsen if he is unable to tolerate fluids at home they should return to the emergency department immediately.  Mom agrees and understands and feels comfortable going home and following up with his doctor within the next 1 to 2 days.  FINAL CLINICAL IMPRESSION(S) / ED DIAGNOSES   Colitis Nausea vomiting   Note:  This document was prepared using Dragon voice recognition software and may include unintentional dictation errors.   Dorothyann Drivers, MD 06/14/24 1734
